# Patient Record
Sex: Female | Born: 1955 | Race: White | Hispanic: No | Marital: Married | Smoking: Never smoker
Health system: Southern US, Community
[De-identification: ages and names within clinical notes are randomized; demographics above are authoritative.]

## PROBLEM LIST (undated history)

## (undated) DIAGNOSIS — D649 Anemia, unspecified: Secondary | ICD-10-CM

## (undated) DIAGNOSIS — Z8489 Family history of other specified conditions: Secondary | ICD-10-CM

## (undated) DIAGNOSIS — I499 Cardiac arrhythmia, unspecified: Secondary | ICD-10-CM

## (undated) DIAGNOSIS — R112 Nausea with vomiting, unspecified: Secondary | ICD-10-CM

## (undated) DIAGNOSIS — R0602 Shortness of breath: Secondary | ICD-10-CM

## (undated) DIAGNOSIS — F329 Major depressive disorder, single episode, unspecified: Secondary | ICD-10-CM

## (undated) DIAGNOSIS — F32A Depression, unspecified: Secondary | ICD-10-CM

## (undated) DIAGNOSIS — I10 Essential (primary) hypertension: Secondary | ICD-10-CM

## (undated) DIAGNOSIS — Z9889 Other specified postprocedural states: Secondary | ICD-10-CM

## (undated) DIAGNOSIS — Z8719 Personal history of other diseases of the digestive system: Secondary | ICD-10-CM

## (undated) DIAGNOSIS — M1990 Unspecified osteoarthritis, unspecified site: Secondary | ICD-10-CM

## (undated) DIAGNOSIS — K5190 Ulcerative colitis, unspecified, without complications: Secondary | ICD-10-CM

## (undated) DIAGNOSIS — F419 Anxiety disorder, unspecified: Secondary | ICD-10-CM

## (undated) HISTORY — PX: CHOLECYSTECTOMY: SHX55

## (undated) HISTORY — PX: HERNIA REPAIR: SHX51

## (undated) HISTORY — PX: MOHS SURGERY: SHX181

## (undated) HISTORY — PX: BACK SURGERY: SHX140

---

## 2000-09-10 ENCOUNTER — Other Ambulatory Visit: Admission: RE | Admit: 2000-09-10 | Discharge: 2000-09-10 | Payer: Self-pay | Admitting: Obstetrics and Gynecology

## 2000-09-23 ENCOUNTER — Other Ambulatory Visit: Admission: RE | Admit: 2000-09-23 | Discharge: 2000-09-23 | Payer: Self-pay | Admitting: Obstetrics and Gynecology

## 2000-09-23 ENCOUNTER — Encounter (INDEPENDENT_AMBULATORY_CARE_PROVIDER_SITE_OTHER): Payer: Self-pay | Admitting: Specialist

## 2000-12-28 ENCOUNTER — Ambulatory Visit (HOSPITAL_COMMUNITY): Admission: RE | Admit: 2000-12-28 | Discharge: 2000-12-28 | Payer: Self-pay | Admitting: Family Medicine

## 2000-12-28 ENCOUNTER — Encounter: Payer: Self-pay | Admitting: Family Medicine

## 2001-01-21 ENCOUNTER — Ambulatory Visit (HOSPITAL_COMMUNITY): Admission: RE | Admit: 2001-01-21 | Discharge: 2001-01-21 | Payer: Self-pay | Admitting: *Deleted

## 2001-01-26 ENCOUNTER — Encounter: Admission: RE | Admit: 2001-01-26 | Discharge: 2001-04-26 | Payer: Self-pay | Admitting: Family Medicine

## 2002-11-13 ENCOUNTER — Other Ambulatory Visit: Admission: RE | Admit: 2002-11-13 | Discharge: 2002-11-13 | Payer: Self-pay | Admitting: Family Medicine

## 2003-07-27 ENCOUNTER — Other Ambulatory Visit: Admission: RE | Admit: 2003-07-27 | Discharge: 2003-07-27 | Payer: Self-pay | Admitting: Family Medicine

## 2006-04-14 ENCOUNTER — Other Ambulatory Visit: Admission: RE | Admit: 2006-04-14 | Discharge: 2006-04-14 | Payer: Self-pay | Admitting: Family Medicine

## 2007-05-23 ENCOUNTER — Other Ambulatory Visit: Admission: RE | Admit: 2007-05-23 | Discharge: 2007-05-23 | Payer: Self-pay | Admitting: Family Medicine

## 2007-07-08 ENCOUNTER — Encounter (INDEPENDENT_AMBULATORY_CARE_PROVIDER_SITE_OTHER): Payer: Self-pay | Admitting: Surgery

## 2007-07-08 ENCOUNTER — Ambulatory Visit (HOSPITAL_COMMUNITY): Admission: RE | Admit: 2007-07-08 | Discharge: 2007-07-08 | Payer: Self-pay | Admitting: Surgery

## 2007-08-04 ENCOUNTER — Encounter: Admission: RE | Admit: 2007-08-04 | Discharge: 2007-08-04 | Payer: Self-pay | Admitting: Surgery

## 2007-09-22 ENCOUNTER — Encounter: Admission: RE | Admit: 2007-09-22 | Discharge: 2007-09-22 | Payer: Self-pay | Admitting: Gastroenterology

## 2007-10-22 ENCOUNTER — Encounter: Admission: RE | Admit: 2007-10-22 | Discharge: 2007-10-22 | Payer: Self-pay | Admitting: Gastroenterology

## 2008-02-03 ENCOUNTER — Ambulatory Visit (HOSPITAL_COMMUNITY): Admission: RE | Admit: 2008-02-03 | Discharge: 2008-02-03 | Payer: Self-pay | Admitting: Gastroenterology

## 2008-03-23 ENCOUNTER — Ambulatory Visit (HOSPITAL_COMMUNITY): Admission: RE | Admit: 2008-03-23 | Discharge: 2008-03-23 | Payer: Self-pay | Admitting: Gastroenterology

## 2010-11-11 NOTE — Op Note (Signed)
NAMECHEYAN, FREES              ACCOUNT NO.:  [id number]   MEDICAL RECORD NO.:  [id number]          PATIENT TYPE:  AMB   LOCATION:  DAY                          FACILITY:  [facility]   PHYSICIAN:  Wilmon Arms. Corliss Skains, M.D. DATE OF BIRTH:  Jul 28, 1955   DATE OF PROCEDURE:  07/08/2007  DATE OF DISCHARGE:                               OPERATIVE REPORT   PREOPERATIVE DIAGNOSIS:  Chronic calculus cholecystitis.   POSTOPERATIVE DIAGNOSIS:  Chronic calculus cholecystitis.   PROCEDURE PERFORMED:  Laparoscopic cholecystectomy with intraoperative  cholangiogram.   SURGEON:  Wilmon Arms. Tsuei, M.D., FACS   ANESTHESIA:  General endotracheal.   INDICATIONS:  The patient is a 55 year old female who presents with a 7  month history of intermittent epigastric pain radiating to her back  associated with nausea, vomiting and bloating.  Ultrasound showed  cholelithiasis with no sign of cholecystitis.  She was referred for  surgical evaluation.  We recommended elective cholecystectomy.   DESCRIPTION OF PROCEDURE:  The patient was brought to the operating room  and placed in the supine position on the operating table.  After an  adequate level of general anesthesia was obtained, the patient's abdomen  was prepped with Betadine and draped in sterile fashion.  The area below  her umbilicus was infiltrated with 0.25% Marcaine with epinephrine.  A  transverse incision was made below the umbilicus.  Dissection was  carried down to the fascia.  The fascia was opened vertically.  The  peritoneal cavity was bluntly entered.  A stay suture of zero Vicryl was  placed around the fascial opening.  The Hasson cannula was inserted and  secured with the stay suture.  Pneumoperitoneum was obtained by  insufflating with CO2 maintaining a maximal pressure of 15 mmHg.  The  laparoscope was inserted.  The patient was positioned in reverse  Trendelenburg and tilted to her left.  An 11 mm port was placed in the  subxiphoid  position.  Two 5 mm ports were placed in the right upper  quadrant.  The gallbladder was then exposed.  The patient has a very  large floppy left lobe of the liver and this precluded [place] seeing the  hilum of the gallbladder.  Also, the patient's gallbladder was quite  distended.  We inserted the suction aspirator into the fundus of the  gallbladder and suctioned out as much bile as we could.  This revealed  that the gallbladder was completely packed with gallstones.  We were  able to elevate it with a grasper.  An additional 5 mm port was placed  in the right upper quadrant and the flexible liver retractor was  inserted and used to retract the left lobe of the liver.  We opened the  peritoneum around the hilum of the gallbladder.  The cystic duct was  circumferentially dissected.  We ligated with a clip distally.  A small  opening was created on the cystic duct.  A Cook cholangiogram catheter  was then inserted and threaded into the cystic duct.  A cholangiogram  was obtained which showed good flow proximally and distally biliary tree  with  no sign of filling defects or obstruction.  The catheter was then  removed and the cystic duct was ligated with clips and divided.  The  cystic artery was also ligated with clips and divided.  The gallbladder  was then dissected free from the liver bed with cautery.  Hemostasis was  good.  The gallbladder was then placed in an EndoCatch sac.  We pulled  the gallbladder and sac up to the umbilical port site.  We had to  enlarge our fascial opening slightly due to the amount of stones in the  gallbladder.  This required opening the gallbladder inside the sac and  removing some of the stones.  We were finally able to remove the  gallbladder from the abdomen.  The fascia was then closed with zero  Vicryl.  We reinspected the right upper quadrant and  suctioned out any irrigation.  Pneumoperitoneum was then released as the  trocars were removed.  Monocryl 4-0  was used to close the skin  incisions.  Steri-Strips and clean dressings were applied.  The patient  was then extubated and brought to the recovery room in stable condition.  All sponge, instrument and needle counts were correct.      Wilmon Arms. Tsuei, M.D.  Electronically Signed     MKT/MEDQ  D:  07/08/2007  T:  07/08/2007  Job:  [id number]   cc:   Sigmund Hazel, M.D.  Fax: [phone]

## 2011-03-19 LAB — DIFFERENTIAL
Basophils Absolute: 0
Lymphocytes Relative: 29
Neutro Abs: 3.8
Neutrophils Relative %: 63

## 2011-03-19 LAB — CBC
HCT: 35.2 — ABNORMAL LOW
MCV: 87.7
RBC: 4.01
WBC: 6

## 2011-03-19 LAB — COMPREHENSIVE METABOLIC PANEL
BUN: 12
CO2: 27
Chloride: 106
Creatinine, Ser: 0.97
GFR calc non Af Amer: >60
Total Bilirubin: 0.9

## 2011-03-27 LAB — CREATININE, SERUM
GFR calc Af Amer: >60
GFR calc non Af Amer: >60

## 2011-09-30 ENCOUNTER — Other Ambulatory Visit (HOSPITAL_COMMUNITY)
Admission: RE | Admit: 2011-09-30 | Discharge: 2011-09-30 | Disposition: A | Discharge status: Home / Self Care | Payer: 59 | Source: Ambulatory Visit | Attending: Family Medicine | Admitting: Family Medicine

## 2011-09-30 ENCOUNTER — Other Ambulatory Visit: Payer: Self-pay | Admitting: Family Medicine

## 2011-09-30 DIAGNOSIS — Z1159 Encounter for screening for other viral diseases: Secondary | ICD-10-CM | POA: Insufficient documentation

## 2011-09-30 DIAGNOSIS — Z01419 Encounter for gynecological examination (general) (routine) without abnormal findings: Secondary | ICD-10-CM | POA: Insufficient documentation

## 2013-01-31 ENCOUNTER — Ambulatory Visit (INDEPENDENT_AMBULATORY_CARE_PROVIDER_SITE_OTHER): Payer: BC Managed Care – PPO | Admitting: Licensed Clinical Social Worker

## 2013-01-31 DIAGNOSIS — F411 Generalized anxiety disorder: Secondary | ICD-10-CM

## 2013-01-31 DIAGNOSIS — F331 Major depressive disorder, recurrent, moderate: Secondary | ICD-10-CM

## 2013-02-07 ENCOUNTER — Ambulatory Visit (INDEPENDENT_AMBULATORY_CARE_PROVIDER_SITE_OTHER): Payer: BC Managed Care – PPO | Admitting: Licensed Clinical Social Worker

## 2013-02-07 DIAGNOSIS — F411 Generalized anxiety disorder: Secondary | ICD-10-CM

## 2013-02-07 DIAGNOSIS — F331 Major depressive disorder, recurrent, moderate: Secondary | ICD-10-CM

## 2013-02-09 ENCOUNTER — Ambulatory Visit (INDEPENDENT_AMBULATORY_CARE_PROVIDER_SITE_OTHER): Payer: BC Managed Care – PPO | Admitting: Licensed Clinical Social Worker

## 2013-02-09 DIAGNOSIS — F331 Major depressive disorder, recurrent, moderate: Secondary | ICD-10-CM

## 2013-02-09 DIAGNOSIS — F411 Generalized anxiety disorder: Secondary | ICD-10-CM

## 2013-02-14 ENCOUNTER — Ambulatory Visit (INDEPENDENT_AMBULATORY_CARE_PROVIDER_SITE_OTHER): Payer: BC Managed Care – PPO | Admitting: Licensed Clinical Social Worker

## 2013-02-14 DIAGNOSIS — F331 Major depressive disorder, recurrent, moderate: Secondary | ICD-10-CM

## 2013-02-16 ENCOUNTER — Ambulatory Visit (INDEPENDENT_AMBULATORY_CARE_PROVIDER_SITE_OTHER): Payer: BC Managed Care – PPO | Admitting: Licensed Clinical Social Worker

## 2013-02-16 DIAGNOSIS — F331 Major depressive disorder, recurrent, moderate: Secondary | ICD-10-CM

## 2013-02-16 DIAGNOSIS — F411 Generalized anxiety disorder: Secondary | ICD-10-CM

## 2013-02-21 ENCOUNTER — Ambulatory Visit (INDEPENDENT_AMBULATORY_CARE_PROVIDER_SITE_OTHER): Payer: BC Managed Care – PPO | Admitting: Licensed Clinical Social Worker

## 2013-02-21 DIAGNOSIS — F331 Major depressive disorder, recurrent, moderate: Secondary | ICD-10-CM

## 2013-02-21 DIAGNOSIS — F411 Generalized anxiety disorder: Secondary | ICD-10-CM

## 2013-02-23 ENCOUNTER — Ambulatory Visit (INDEPENDENT_AMBULATORY_CARE_PROVIDER_SITE_OTHER): Payer: BC Managed Care – PPO | Admitting: Licensed Clinical Social Worker

## 2013-02-23 DIAGNOSIS — F331 Major depressive disorder, recurrent, moderate: Secondary | ICD-10-CM

## 2013-02-23 DIAGNOSIS — F411 Generalized anxiety disorder: Secondary | ICD-10-CM

## 2013-02-28 ENCOUNTER — Ambulatory Visit (INDEPENDENT_AMBULATORY_CARE_PROVIDER_SITE_OTHER): Payer: BC Managed Care – PPO | Admitting: Licensed Clinical Social Worker

## 2013-02-28 DIAGNOSIS — F411 Generalized anxiety disorder: Secondary | ICD-10-CM

## 2013-02-28 DIAGNOSIS — F331 Major depressive disorder, recurrent, moderate: Secondary | ICD-10-CM

## 2013-03-02 ENCOUNTER — Ambulatory Visit (INDEPENDENT_AMBULATORY_CARE_PROVIDER_SITE_OTHER): Payer: BC Managed Care – PPO | Admitting: Licensed Clinical Social Worker

## 2013-03-02 DIAGNOSIS — F331 Major depressive disorder, recurrent, moderate: Secondary | ICD-10-CM

## 2013-03-02 DIAGNOSIS — F411 Generalized anxiety disorder: Secondary | ICD-10-CM

## 2013-03-14 ENCOUNTER — Ambulatory Visit (INDEPENDENT_AMBULATORY_CARE_PROVIDER_SITE_OTHER): Payer: BC Managed Care – PPO | Admitting: Licensed Clinical Social Worker

## 2013-03-14 DIAGNOSIS — F411 Generalized anxiety disorder: Secondary | ICD-10-CM

## 2013-03-14 DIAGNOSIS — F331 Major depressive disorder, recurrent, moderate: Secondary | ICD-10-CM

## 2013-03-30 ENCOUNTER — Ambulatory Visit: Payer: BC Managed Care – PPO | Admitting: Licensed Clinical Social Worker

## 2013-04-11 ENCOUNTER — Ambulatory Visit (INDEPENDENT_AMBULATORY_CARE_PROVIDER_SITE_OTHER): Payer: BC Managed Care – PPO | Admitting: Licensed Clinical Social Worker

## 2013-04-11 DIAGNOSIS — F331 Major depressive disorder, recurrent, moderate: Secondary | ICD-10-CM

## 2013-04-11 DIAGNOSIS — F411 Generalized anxiety disorder: Secondary | ICD-10-CM

## 2013-04-27 ENCOUNTER — Ambulatory Visit (INDEPENDENT_AMBULATORY_CARE_PROVIDER_SITE_OTHER): Payer: BC Managed Care – PPO | Admitting: Licensed Clinical Social Worker

## 2013-04-27 DIAGNOSIS — F331 Major depressive disorder, recurrent, moderate: Secondary | ICD-10-CM

## 2013-04-27 DIAGNOSIS — F411 Generalized anxiety disorder: Secondary | ICD-10-CM

## 2013-05-18 ENCOUNTER — Ambulatory Visit: Payer: BC Managed Care – PPO | Admitting: Licensed Clinical Social Worker

## 2013-05-24 ENCOUNTER — Ambulatory Visit (INDEPENDENT_AMBULATORY_CARE_PROVIDER_SITE_OTHER): Payer: BC Managed Care – PPO | Admitting: Licensed Clinical Social Worker

## 2013-05-24 DIAGNOSIS — F411 Generalized anxiety disorder: Secondary | ICD-10-CM

## 2013-05-24 DIAGNOSIS — F331 Major depressive disorder, recurrent, moderate: Secondary | ICD-10-CM

## 2013-06-15 ENCOUNTER — Ambulatory Visit (INDEPENDENT_AMBULATORY_CARE_PROVIDER_SITE_OTHER): Payer: BC Managed Care – PPO | Admitting: Licensed Clinical Social Worker

## 2013-06-15 DIAGNOSIS — F411 Generalized anxiety disorder: Secondary | ICD-10-CM

## 2013-06-15 DIAGNOSIS — F331 Major depressive disorder, recurrent, moderate: Secondary | ICD-10-CM

## 2013-07-05 ENCOUNTER — Ambulatory Visit: Payer: 59 | Admitting: Licensed Clinical Social Worker

## 2013-07-06 ENCOUNTER — Ambulatory Visit (INDEPENDENT_AMBULATORY_CARE_PROVIDER_SITE_OTHER): Payer: 59 | Admitting: Licensed Clinical Social Worker

## 2013-07-06 DIAGNOSIS — F331 Major depressive disorder, recurrent, moderate: Secondary | ICD-10-CM

## 2013-07-06 DIAGNOSIS — F411 Generalized anxiety disorder: Secondary | ICD-10-CM

## 2013-07-14 ENCOUNTER — Ambulatory Visit: Payer: 59 | Admitting: Licensed Clinical Social Worker

## 2013-07-19 ENCOUNTER — Ambulatory Visit (INDEPENDENT_AMBULATORY_CARE_PROVIDER_SITE_OTHER): Payer: 59 | Admitting: Licensed Clinical Social Worker

## 2013-07-19 DIAGNOSIS — F411 Generalized anxiety disorder: Secondary | ICD-10-CM

## 2013-07-19 DIAGNOSIS — F331 Major depressive disorder, recurrent, moderate: Secondary | ICD-10-CM

## 2013-07-28 ENCOUNTER — Ambulatory Visit (INDEPENDENT_AMBULATORY_CARE_PROVIDER_SITE_OTHER): Payer: 59 | Admitting: Licensed Clinical Social Worker

## 2013-07-28 DIAGNOSIS — F331 Major depressive disorder, recurrent, moderate: Secondary | ICD-10-CM

## 2013-07-28 DIAGNOSIS — F411 Generalized anxiety disorder: Secondary | ICD-10-CM

## 2013-08-04 ENCOUNTER — Ambulatory Visit (INDEPENDENT_AMBULATORY_CARE_PROVIDER_SITE_OTHER): Payer: 59 | Admitting: Licensed Clinical Social Worker

## 2013-08-04 DIAGNOSIS — F411 Generalized anxiety disorder: Secondary | ICD-10-CM

## 2013-08-04 DIAGNOSIS — F331 Major depressive disorder, recurrent, moderate: Secondary | ICD-10-CM

## 2013-08-17 ENCOUNTER — Ambulatory Visit: Payer: 59 | Admitting: Licensed Clinical Social Worker

## 2013-08-29 ENCOUNTER — Ambulatory Visit (INDEPENDENT_AMBULATORY_CARE_PROVIDER_SITE_OTHER): Payer: 59 | Admitting: Licensed Clinical Social Worker

## 2013-08-29 DIAGNOSIS — F331 Major depressive disorder, recurrent, moderate: Secondary | ICD-10-CM

## 2013-08-29 DIAGNOSIS — F411 Generalized anxiety disorder: Secondary | ICD-10-CM

## 2013-09-05 ENCOUNTER — Ambulatory Visit (INDEPENDENT_AMBULATORY_CARE_PROVIDER_SITE_OTHER): Payer: 59 | Admitting: Licensed Clinical Social Worker

## 2013-09-05 DIAGNOSIS — F331 Major depressive disorder, recurrent, moderate: Secondary | ICD-10-CM

## 2013-09-05 DIAGNOSIS — F411 Generalized anxiety disorder: Secondary | ICD-10-CM

## 2013-09-08 ENCOUNTER — Ambulatory Visit: Payer: 59 | Admitting: Licensed Clinical Social Worker

## 2013-09-26 ENCOUNTER — Ambulatory Visit: Payer: 59 | Admitting: Licensed Clinical Social Worker

## 2013-10-23 ENCOUNTER — Other Ambulatory Visit: Payer: Self-pay | Admitting: Orthopedic Surgery

## 2013-10-25 ENCOUNTER — Inpatient Hospital Stay (HOSPITAL_COMMUNITY)
Admission: RE | Admit: 2013-10-25 | Discharge: 2013-10-25 | Disposition: A | Discharge status: Home / Self Care | Payer: Self-pay | Source: Ambulatory Visit

## 2013-10-25 NOTE — Progress Notes (Signed)
Not here for pre-admit visit.  Attempted to reach Janice Orr at home number, that number has been disconnected , her cell number is not accepting calls at this time.  Message left at her work number to return call.

## 2013-10-26 ENCOUNTER — Encounter (HOSPITAL_COMMUNITY): Payer: Self-pay

## 2013-10-26 ENCOUNTER — Encounter (HOSPITAL_COMMUNITY)
Admission: RE | Admit: 2013-10-26 | Discharge: 2013-10-26 | Disposition: A | Discharge status: Home / Self Care | Payer: 59 | Source: Ambulatory Visit | Attending: Orthopedic Surgery | Admitting: Orthopedic Surgery

## 2013-10-26 ENCOUNTER — Ambulatory Visit (HOSPITAL_COMMUNITY)
Admission: RE | Admit: 2013-10-26 | Discharge: 2013-10-26 | Disposition: A | Discharge status: Home / Self Care | Payer: 59 | Source: Ambulatory Visit | Attending: Orthopedic Surgery | Admitting: Orthopedic Surgery

## 2013-10-26 DIAGNOSIS — Z01812 Encounter for preprocedural laboratory examination: Secondary | ICD-10-CM | POA: Diagnosis present

## 2013-10-26 DIAGNOSIS — Z01818 Encounter for other preprocedural examination: Secondary | ICD-10-CM | POA: Insufficient documentation

## 2013-10-26 HISTORY — DX: Shortness of breath: R06.02

## 2013-10-26 HISTORY — DX: Other specified postprocedural states: Z98.890

## 2013-10-26 HISTORY — DX: Essential (primary) hypertension: I10

## 2013-10-26 HISTORY — DX: Anemia, unspecified: D64.9

## 2013-10-26 HISTORY — DX: Major depressive disorder, single episode, unspecified: F32.9

## 2013-10-26 HISTORY — DX: Anxiety disorder, unspecified: F41.9

## 2013-10-26 HISTORY — DX: Depression, unspecified: F32.A

## 2013-10-26 HISTORY — DX: Nausea with vomiting, unspecified: R11.2

## 2013-10-26 HISTORY — DX: Personal history of other diseases of the digestive system: Z87.19

## 2013-10-26 HISTORY — DX: Family history of other specified conditions: Z84.89

## 2013-10-26 LAB — CBC WITH DIFFERENTIAL/PLATELET
BASOS ABS: 0 10*3/uL (ref 0.0–0.1)
BASOS PCT: 0 % (ref 0–1)
Eosinophils Absolute: 0.2 10*3/uL (ref 0.0–0.7)
Eosinophils Relative: 4 % (ref 0–5)
HCT: 41.8 % (ref 36.0–46.0)
Hemoglobin: 13.5 g/dL (ref 12.0–15.0)
Lymphocytes Relative: 23 % (ref 12–46)
Lymphs Abs: 1.4 10*3/uL (ref 0.7–4.0)
MCH: 31 pg (ref 26.0–34.0)
MCHC: 32.3 g/dL (ref 30.0–36.0)
MCV: 96.1 fL (ref 78.0–100.0)
Monocytes Absolute: 0.5 10*3/uL (ref 0.1–1.0)
Monocytes Relative: 8 % (ref 3–12)
NEUTROS ABS: 3.9 10*3/uL (ref 1.7–7.7)
Neutrophils Relative %: 65 % (ref 43–77)
PLATELETS: 304 10*3/uL (ref 150–400)
RBC: 4.35 MIL/uL (ref 3.87–5.11)
RDW: 12.4 % (ref 11.5–15.5)
WBC: 6 10*3/uL (ref 4.0–10.5)

## 2013-10-26 LAB — BASIC METABOLIC PANEL
BUN: 16 mg/dL (ref 6–23)
CALCIUM: 8.9 mg/dL (ref 8.4–10.5)
CO2: 24 mEq/L (ref 19–32)
Chloride: 101 mEq/L (ref 96–112)
Creatinine, Ser: 0.92 mg/dL (ref 0.50–1.10)
GFR, EST AFRICAN AMERICAN: 77 mL/min — AB (ref >90)
GFR, EST NON AFRICAN AMERICAN: 67 mL/min — AB (ref >90)
Glucose, Bld: 82 mg/dL (ref 70–99)
POTASSIUM: 4 meq/L (ref 3.7–5.3)
Sodium: 140 mEq/L (ref 137–147)

## 2013-10-26 LAB — APTT: aPTT: 30 seconds (ref 24–37)

## 2013-10-26 LAB — URINALYSIS, ROUTINE W REFLEX MICROSCOPIC
Glucose, UA: NEGATIVE mg/dL
HGB URINE DIPSTICK: NEGATIVE
KETONES UR: NEGATIVE mg/dL
Leukocytes, UA: NEGATIVE
Nitrite: NEGATIVE
PROTEIN: NEGATIVE mg/dL
Specific Gravity, Urine: 1.025 (ref 1.005–1.030)
Urobilinogen, UA: 1 mg/dL (ref 0.0–1.0)
pH: 5 (ref 5.0–8.0)

## 2013-10-26 LAB — SURGICAL PCR SCREEN
MRSA, PCR: NEGATIVE
STAPHYLOCOCCUS AUREUS: POSITIVE — AB

## 2013-10-26 LAB — TYPE AND SCREEN
ABO/RH(D): O POS
Antibody Screen: NEGATIVE

## 2013-10-26 LAB — PROTIME-INR
INR: 0.92 (ref 0.00–1.49)
Prothrombin Time: 12.2 seconds (ref 11.6–15.2)

## 2013-10-26 LAB — ABO/RH: ABO/RH(D): O POS

## 2013-10-26 NOTE — Pre-Procedure Instructions (Signed)
Janice Orr  10/26/2013   Your procedure is scheduled on:  May 4 at 0955  Report to [facility] Admitting at 0800 AM.  Call this number if you have problems the morning of surgery: [phone]   Remember:   Do not eat food or drink liquids after midnight.   Take these medicines the morning of surgery with A SIP OF WATER:   Stop taking Aspirin, Aleve, Ibuprofen, BC's, Goody's, Herbal medications, and Fish Oil   Do not wear jewelry, make-up or nail polish.  Do not wear lotions, powders, or perfumes. You may wear deodorant.  Do not shave 48 hours prior to surgery. Men may shave face and neck.  Do not bring valuables to the hospital.  [facility] is not responsible  for any belongings or valuables.               Contacts, dentures or bridgework may not be worn into surgery.  Leave suitcase in the car. After surgery it may be brought to your room.  For patients admitted to the hospital, discharge time is determined by your treatment team.               Patients discharged the day of surgery will not be allowed to drive home.    Special Instructions: [facility] - Preparing for Surgery  Before surgery, you can play an important role.  Because skin is not sterile, your skin needs to be as free of germs as possible.  You can reduce the number of germs on you skin by washing with CHG (chlorahexidine gluconate) soap before surgery.  CHG is an antiseptic cleaner which kills germs and bonds with the skin to continue killing germs even after washing.  Please DO NOT use if you have an allergy to CHG or antibacterial soaps.  If your skin becomes reddened/irritated stop using the CHG and inform your nurse when you arrive at Short Stay.  Do not shave (including legs and underarms) for at least 48 hours prior to the first CHG shower.  You may shave your face.  Please follow these instructions carefully:   1.  Shower with CHG Soap the night before surgery and the  morning of  Surgery.  2.  If you choose to wash your hair, wash your hair first as usual with your  normal shampoo.  3.  After you shampoo, rinse your hair and body thoroughly to remove the  Shampoo.  4.  Use CHG as you would any other liquid soap.  You can apply chg directly to the skin and wash gently with scrungie or a clean washcloth.  5.  Apply the CHG Soap to your body ONLY FROM THE NECK DOWN.   Do not use on open wounds or open sores.  Avoid contact with your eyes, ears, mouth and genitals (private parts).  Wash genitals (private parts) with your normal soap.  6.  Wash thoroughly, paying special attention to the area where your surgery will be performed.  7.  Thoroughly rinse your body with warm water from the neck down.  8.  DO NOT shower/wash with your normal soap after using and rinsing off  the CHG Soap.  9.  Pat yourself dry with a clean towel.            10.  Wear clean pajamas.            11.  Place clean sheets on your bed the night of your first shower  and do not sleep with pets.  Day of Surgery  Do not apply any lotions/deoderants the morning of surgery.  Please wear clean clothes to the hospital/surgery center.     Please read over the following fact sheets that you were given: Pain Booklet, Coughing and Deep Breathing, Blood Transfusion Information, MRSA Information and Surgical Site Infection Prevention

## 2013-10-26 NOTE — Progress Notes (Addendum)
Denies any cardiac history.   States she had echo--"yrs ago" but can't remember what for.  She does suffer from claustrophia and anxiety.  DA

## 2013-10-26 NOTE — H&P (Signed)
TOTAL HIP ADMISSION H&P  Patient is admitted for right total hip arthroplasty.  Subjective:  Chief Complaint: right hip pain  HPI: Janice Orr, 58 y.o. female, has a history of pain and functional disability in the right hip(s) due to arthritis and patient has failed non-surgical conservative treatments for greater than 12 weeks to include NSAID's and/or analgesics, corticosteriod injections, flexibility and strengthening excercises, weight reduction as appropriate and activity modification.  Onset of symptoms was abrupt starting 1 years ago with rapidlly worsening course since that time.The patient noted no past surgery on the right hip(s).  Patient currently rates pain in the right hip at 10 out of 10 with activity. Patient has night pain, worsening of pain with activity and weight bearing, pain that interfers with activities of daily living and pain with passive range of motion. Patient has evidence of joint space narrowing by imaging studies. This condition presents safety issues increasing the risk of falls. There is no current active infection.  There are no active problems to display for this patient.  Past Medical History  Diagnosis Date  . PONV (postoperative nausea and vomiting)   . Family history of anesthesia complication     father hard to wake up and N/V  . H/O hiatal hernia   . Shortness of breath     states its from anxiety & allergies  . Hypertension   . Anxiety   . Depression   . Anemia     h/o of....none at the moment    Past Surgical History  Procedure Laterality Date  . Back surgery      lumbar  . Cholecystectomy    . Mohs surgery      No prescriptions prior to admission   Allergies  Allergen Reactions  . Codeine Hives    Hallucinations    History  Substance Use Topics  . Smoking status: Never Smoker   . Smokeless tobacco: Not on file  . Alcohol Use: No     Comment: occasiona    No family history on file.   Review of Systems  Constitutional:  Negative.   HENT: Negative.   Eyes: Negative.        Glasses  Respiratory: Positive for shortness of breath.   Cardiovascular: Negative.   Genitourinary: Negative.   Musculoskeletal: Positive for joint pain.  Skin: Negative.   Neurological:       Balance problems  Endo/Heme/Allergies: Bruises/bleeds easily.  Psychiatric/Behavioral: The patient is nervous/anxious.     Objective:  Physical Exam  Constitutional: She is oriented to person, place, and time. She appears well-developed and well-nourished.  HENT:  Head: Normocephalic and atraumatic.  Eyes: Pupils are equal, round, and reactive to light.  Neck: Normal range of motion. Neck supple.  Cardiovascular: Intact distal pulses.   Respiratory: Effort normal.  Musculoskeletal: She exhibits tenderness.  Neurological: She is alert and oriented to person, place, and time.  Skin: Skin is warm and dry.  Psychiatric: She has a normal mood and affect. Her behavior is normal. Judgment and thought content normal.    Vital signs in last 24 hours: Temp:  [98.2 F (36.8 C)] 98.2 F (36.8 C) (04/30 0817) Pulse Rate:  [79] 79 (04/30 0817) Resp:  [20] 20 (04/30 0817) BP: (104)/(71) 104/71 mmHg (04/30 0817) SpO2:  [99 %] 99 % (04/30 0817) Weight:  [88.587 kg (195 lb 4.8 oz)] 88.587 kg (195 lb 4.8 oz) (04/30 0817)  Labs:   There is no height or weight on file to  calculate BMI.   Imaging Review X-rays were once again reviewed and do show bone-on-bone arthritic changes to the superior aspect of the right hip joint.    Assessment/Plan:  End stage arthritis, right hip(s)  The patient history, physical examination, clinical judgement of the provider and imaging studies are consistent with end stage degenerative joint disease of the right hip(s) and total hip arthroplasty is deemed medically necessary. The treatment options including medical management, injection therapy, arthroscopy and arthroplasty were discussed at length. The risks  and benefits of total hip arthroplasty were presented and reviewed. The risks due to aseptic loosening, infection, stiffness, dislocation/subluxation,  thromboembolic complications and other imponderables were discussed.  The patient acknowledged the explanation, agreed to proceed with the plan and consent was signed. Patient is being admitted for inpatient treatment for surgery, pain control, PT, OT, prophylactic antibiotics, VTE prophylaxis, progressive ambulation and ADL's and discharge planning.The patient is planning to be discharged home with home health services

## 2013-10-27 NOTE — Progress Notes (Signed)
Left message for patient to arrive and 07:15

## 2013-10-29 MED ORDER — CEFAZOLIN SODIUM-DEXTROSE 2-3 GM-% IV SOLR: 2.0000 g | INTRAVENOUS | Status: DC

## 2013-10-29 MED ORDER — CHLORHEXIDINE GLUCONATE 4 % EX LIQD
60.0000 mL | Freq: Once | CUTANEOUS | Status: DC
  Filled 2013-10-29: qty 60

## 2013-10-29 MED ORDER — DEXTROSE-NACL 5-0.45 % IV SOLN: INTRAVENOUS | Status: DC

## 2013-10-30 ENCOUNTER — Encounter (HOSPITAL_COMMUNITY)
Admission: RE | Disposition: A | Discharge status: Home Health | Payer: Self-pay | Source: Ambulatory Visit | Attending: Orthopedic Surgery

## 2013-10-30 ENCOUNTER — Inpatient Hospital Stay (HOSPITAL_COMMUNITY)
Admission: RE | Admit: 2013-10-30 | Discharge: 2013-11-01 | DRG: 470 | Disposition: A | Discharge status: Home Health | Payer: 59 | Source: Ambulatory Visit | Attending: Orthopedic Surgery | Admitting: Orthopedic Surgery

## 2013-10-30 ENCOUNTER — Inpatient Hospital Stay (HOSPITAL_COMMUNITY): Payer: 59 | Admitting: Anesthesiology

## 2013-10-30 ENCOUNTER — Encounter (HOSPITAL_COMMUNITY): Payer: Self-pay | Admitting: Anesthesiology

## 2013-10-30 ENCOUNTER — Encounter (HOSPITAL_COMMUNITY): Payer: 59 | Admitting: Anesthesiology

## 2013-10-30 ENCOUNTER — Inpatient Hospital Stay (HOSPITAL_COMMUNITY): Payer: 59

## 2013-10-30 DIAGNOSIS — M169 Osteoarthritis of hip, unspecified: Principal | ICD-10-CM | POA: Diagnosis present

## 2013-10-30 DIAGNOSIS — Z79899 Other long term (current) drug therapy: Secondary | ICD-10-CM

## 2013-10-30 DIAGNOSIS — Z888 Allergy status to other drugs, medicaments and biological substances status: Secondary | ICD-10-CM

## 2013-10-30 DIAGNOSIS — F411 Generalized anxiety disorder: Secondary | ICD-10-CM | POA: Diagnosis present

## 2013-10-30 DIAGNOSIS — F329 Major depressive disorder, single episode, unspecified: Secondary | ICD-10-CM | POA: Diagnosis present

## 2013-10-30 DIAGNOSIS — I10 Essential (primary) hypertension: Secondary | ICD-10-CM | POA: Diagnosis present

## 2013-10-30 DIAGNOSIS — M1610 Unilateral primary osteoarthritis, unspecified hip: Principal | ICD-10-CM | POA: Diagnosis present

## 2013-10-30 DIAGNOSIS — Z7982 Long term (current) use of aspirin: Secondary | ICD-10-CM

## 2013-10-30 DIAGNOSIS — F3289 Other specified depressive episodes: Secondary | ICD-10-CM | POA: Diagnosis present

## 2013-10-30 DIAGNOSIS — Z9089 Acquired absence of other organs: Secondary | ICD-10-CM

## 2013-10-30 DIAGNOSIS — M1611 Unilateral primary osteoarthritis, right hip: Secondary | ICD-10-CM | POA: Diagnosis present

## 2013-10-30 HISTORY — DX: Unspecified osteoarthritis, unspecified site: M19.90

## 2013-10-30 HISTORY — PX: TOTAL HIP ARTHROPLASTY: SHX124

## 2013-10-30 SURGERY — ARTHROPLASTY, HIP, TOTAL,POSTERIOR APPROACH
Anesthesia: General | Site: Hip | Laterality: Right

## 2013-10-30 MED ORDER — LIDOCAINE HCL (CARDIAC) 20 MG/ML IV SOLN
INTRAVENOUS | Status: DC | PRN
  Administered 2013-10-30: 80 mg via INTRAVENOUS

## 2013-10-30 MED ORDER — LOSARTAN POTASSIUM 50 MG PO TABS
50.0000 mg | ORAL_TABLET | Freq: Every day | ORAL | Status: DC
  Administered 2013-10-30 – 2013-11-01 (×3): 50 mg via ORAL
  Filled 2013-10-30 (×4): qty 1

## 2013-10-30 MED ORDER — DEXAMETHASONE SODIUM PHOSPHATE 10 MG/ML IJ SOLN
INTRAMUSCULAR | Status: DC | PRN
  Administered 2013-10-30: 8 mg via INTRAVENOUS

## 2013-10-30 MED ORDER — PHENYLEPHRINE HCL 10 MG/ML IJ SOLN
INTRAMUSCULAR | Status: DC | PRN
  Administered 2013-10-30 (×2): 80 ug via INTRAVENOUS

## 2013-10-30 MED ORDER — PROPOFOL 10 MG/ML IV BOLUS
INTRAVENOUS | Status: AC
  Filled 2013-10-30: qty 20

## 2013-10-30 MED ORDER — OXYCODONE HCL 5 MG PO TABS
5.0000 mg | ORAL_TABLET | ORAL | Status: DC | PRN
  Administered 2013-10-30: 5 mg via ORAL
  Administered 2013-10-30 – 2013-10-31 (×3): 10 mg via ORAL
  Filled 2013-10-30: qty 2
  Filled 2013-10-30: qty 1
  Filled 2013-10-30: qty 2
  Filled 2013-10-30: qty 1
  Filled 2013-10-30: qty 2

## 2013-10-30 MED ORDER — EPHEDRINE SULFATE 50 MG/ML IJ SOLN
INTRAMUSCULAR | Status: DC | PRN
  Administered 2013-10-30 (×2): 5 mg via INTRAVENOUS
  Administered 2013-10-30: 10 mg via INTRAVENOUS
  Administered 2013-10-30: 5 mg via INTRAVENOUS

## 2013-10-30 MED ORDER — SODIUM CHLORIDE 0.9 % IJ SOLN
INTRAMUSCULAR | Status: AC
  Filled 2013-10-30: qty 10

## 2013-10-30 MED ORDER — KCL IN DEXTROSE-NACL 20-5-0.45 MEQ/L-%-% IV SOLN
INTRAVENOUS | Status: DC
  Administered 2013-10-30 – 2013-11-01 (×3): via INTRAVENOUS
  Filled 2013-10-30 (×11): qty 1000

## 2013-10-30 MED ORDER — ONDANSETRON HCL 4 MG/2ML IJ SOLN
INTRAMUSCULAR | Status: AC
  Filled 2013-10-30: qty 2

## 2013-10-30 MED ORDER — LACTATED RINGERS IV SOLN
INTRAVENOUS | Status: DC
  Administered 2013-10-30: 08:00:00 via INTRAVENOUS

## 2013-10-30 MED ORDER — FENTANYL CITRATE 0.05 MG/ML IJ SOLN
INTRAMUSCULAR | Status: AC
Start: 2013-10-30 — End: 2013-10-30
  Filled 2013-10-30: qty 5

## 2013-10-30 MED ORDER — HYDROMORPHONE HCL PF 1 MG/ML IJ SOLN
0.2500 mg | INTRAMUSCULAR | Status: DC | PRN
  Administered 2013-10-30: 0.5 mg via INTRAVENOUS
  Administered 2013-10-30 (×2): 0.25 mg via INTRAVENOUS
  Administered 2013-10-30: 0.5 mg via INTRAVENOUS

## 2013-10-30 MED ORDER — GLYCOPYRROLATE 0.2 MG/ML IJ SOLN
INTRAMUSCULAR | Status: AC
  Filled 2013-10-30: qty 2

## 2013-10-30 MED ORDER — ACETAMINOPHEN 325 MG PO TABS
650.0000 mg | ORAL_TABLET | Freq: Four times a day (QID) | ORAL | Status: DC | PRN
  Administered 2013-10-31: 650 mg via ORAL
  Filled 2013-10-30: qty 2

## 2013-10-30 MED ORDER — FENTANYL CITRATE 0.05 MG/ML IJ SOLN: 50.0000 ug | Freq: Once | INTRAMUSCULAR | Status: DC

## 2013-10-30 MED ORDER — FLUOXETINE HCL 20 MG PO CAPS
40.0000 mg | ORAL_CAPSULE | Freq: Every day | ORAL | Status: DC
  Administered 2013-10-31 – 2013-11-01 (×2): 40 mg via ORAL
  Filled 2013-10-30 (×2): qty 2

## 2013-10-30 MED ORDER — HYDROMORPHONE HCL PF 1 MG/ML IJ SOLN
INTRAMUSCULAR | Status: AC
  Filled 2013-10-30: qty 1

## 2013-10-30 MED ORDER — ACETAMINOPHEN 650 MG RE SUPP: 650.0000 mg | Freq: Four times a day (QID) | RECTAL | Status: DC | PRN

## 2013-10-30 MED ORDER — DIPHENHYDRAMINE HCL 12.5 MG/5ML PO ELIX: 12.5000 mg | ORAL_SOLUTION | ORAL | Status: DC | PRN

## 2013-10-30 MED ORDER — GLYCOPYRROLATE 0.2 MG/ML IJ SOLN
INTRAMUSCULAR | Status: AC
  Filled 2013-10-30: qty 1

## 2013-10-30 MED ORDER — ONDANSETRON HCL 4 MG/2ML IJ SOLN
INTRAMUSCULAR | Status: DC | PRN
  Administered 2013-10-30: 4 mg via INTRAVENOUS

## 2013-10-30 MED ORDER — GLYCOPYRROLATE 0.2 MG/ML IJ SOLN
INTRAMUSCULAR | Status: DC | PRN
  Administered 2013-10-30: 0.4 mg via INTRAVENOUS

## 2013-10-30 MED ORDER — SENNOSIDES-DOCUSATE SODIUM 8.6-50 MG PO TABS: 1.0000 | ORAL_TABLET | Freq: Every evening | ORAL | Status: DC | PRN

## 2013-10-30 MED ORDER — OXYCODONE HCL 5 MG PO TABS: 5.0000 mg | ORAL_TABLET | Freq: Once | ORAL | Status: DC | PRN

## 2013-10-30 MED ORDER — BISACODYL 5 MG PO TBEC: 5.0000 mg | DELAYED_RELEASE_TABLET | Freq: Every day | ORAL | Status: DC | PRN

## 2013-10-30 MED ORDER — ONDANSETRON HCL 4 MG/2ML IJ SOLN
4.0000 mg | Freq: Four times a day (QID) | INTRAMUSCULAR | Status: DC | PRN
  Administered 2013-10-30 – 2013-10-31 (×2): 4 mg via INTRAVENOUS
  Filled 2013-10-30 (×2): qty 2

## 2013-10-30 MED ORDER — METOCLOPRAMIDE HCL 5 MG/ML IJ SOLN
5.0000 mg | Freq: Three times a day (TID) | INTRAMUSCULAR | Status: DC | PRN
  Administered 2013-10-30 – 2013-10-31 (×2): 10 mg via INTRAVENOUS
  Filled 2013-10-30 (×2): qty 2

## 2013-10-30 MED ORDER — HYDROMORPHONE HCL PF 1 MG/ML IJ SOLN
1.0000 mg | INTRAMUSCULAR | Status: DC | PRN
  Administered 2013-10-30 – 2013-11-01 (×4): 1 mg via INTRAVENOUS
  Filled 2013-10-30 (×3): qty 1

## 2013-10-30 MED ORDER — LACTATED RINGERS IV SOLN
INTRAVENOUS | Status: DC | PRN
  Administered 2013-10-30 (×3): via INTRAVENOUS

## 2013-10-30 MED ORDER — METHOCARBAMOL 500 MG PO TABS
500.0000 mg | ORAL_TABLET | Freq: Four times a day (QID) | ORAL | Status: DC | PRN
  Administered 2013-10-30 – 2013-10-31 (×3): 500 mg via ORAL
  Filled 2013-10-30 (×3): qty 1

## 2013-10-30 MED ORDER — METHOCARBAMOL 1000 MG/10ML IJ SOLN
500.0000 mg | Freq: Four times a day (QID) | INTRAVENOUS | Status: DC | PRN
  Filled 2013-10-30 (×3): qty 5

## 2013-10-30 MED ORDER — MIDAZOLAM HCL 5 MG/5ML IJ SOLN
INTRAMUSCULAR | Status: DC | PRN
  Administered 2013-10-30: 2 mg via INTRAVENOUS

## 2013-10-30 MED ORDER — PROMETHAZINE HCL 25 MG/ML IJ SOLN
6.2500 mg | INTRAMUSCULAR | Status: DC | PRN
  Administered 2013-10-30: 6.25 mg via INTRAVENOUS

## 2013-10-30 MED ORDER — LORATADINE 10 MG PO TABS
10.0000 mg | ORAL_TABLET | Freq: Every day | ORAL | Status: DC
  Administered 2013-10-30 – 2013-11-01 (×3): 10 mg via ORAL
  Filled 2013-10-30 (×4): qty 1

## 2013-10-30 MED ORDER — BUPIVACAINE-EPINEPHRINE 0.5% -1:200000 IJ SOLN
INTRAMUSCULAR | Status: DC | PRN
Start: 2013-10-30 — End: 2013-10-30
  Administered 2013-10-30: 20 mL

## 2013-10-30 MED ORDER — METOPROLOL SUCCINATE ER 25 MG PO TB24
25.0000 mg | ORAL_TABLET | Freq: Every day | ORAL | Status: DC
  Administered 2013-10-31 – 2013-11-01 (×2): 25 mg via ORAL
  Filled 2013-10-30 (×2): qty 1

## 2013-10-30 MED ORDER — ONDANSETRON HCL 4 MG PO TABS
4.0000 mg | ORAL_TABLET | Freq: Four times a day (QID) | ORAL | Status: DC | PRN
Start: 2013-10-30 — End: 2013-11-01
  Administered 2013-10-31: 4 mg via ORAL
  Filled 2013-10-30 (×2): qty 1

## 2013-10-30 MED ORDER — DOCUSATE SODIUM 100 MG PO CAPS
100.0000 mg | ORAL_CAPSULE | Freq: Two times a day (BID) | ORAL | Status: DC
  Administered 2013-10-30 – 2013-11-01 (×3): 100 mg via ORAL
  Filled 2013-10-30 (×5): qty 1

## 2013-10-30 MED ORDER — SUCCINYLCHOLINE CHLORIDE 20 MG/ML IJ SOLN
INTRAMUSCULAR | Status: AC
  Filled 2013-10-30: qty 1

## 2013-10-30 MED ORDER — ROCURONIUM BROMIDE 100 MG/10ML IV SOLN
INTRAVENOUS | Status: DC | PRN
  Administered 2013-10-30: 30 mg via INTRAVENOUS

## 2013-10-30 MED ORDER — PHENOL 1.4 % MT LIQD: 1.0000 | OROMUCOSAL | Status: DC | PRN

## 2013-10-30 MED ORDER — LIDOCAINE HCL (CARDIAC) 20 MG/ML IV SOLN
INTRAVENOUS | Status: AC
  Filled 2013-10-30: qty 5

## 2013-10-30 MED ORDER — MUPIROCIN 2 % EX OINT
TOPICAL_OINTMENT | Freq: Once | CUTANEOUS | Status: AC
  Administered 2013-10-30: 1 via NASAL
  Filled 2013-10-30: qty 22

## 2013-10-30 MED ORDER — MENTHOL 3 MG MT LOZG: 1.0000 | LOZENGE | OROMUCOSAL | Status: DC | PRN

## 2013-10-30 MED ORDER — SUCCINYLCHOLINE CHLORIDE 20 MG/ML IJ SOLN
INTRAMUSCULAR | Status: DC | PRN
  Administered 2013-10-30: 120 mg via INTRAVENOUS

## 2013-10-30 MED ORDER — CEFAZOLIN SODIUM-DEXTROSE 2-3 GM-% IV SOLR
2.0000 g | Freq: Once | INTRAVENOUS | Status: AC
  Administered 2013-10-30: 2 g via INTRAVENOUS
  Filled 2013-10-30: qty 50

## 2013-10-30 MED ORDER — ASPIRIN EC 325 MG PO TBEC
325.0000 mg | DELAYED_RELEASE_TABLET | Freq: Every day | ORAL | Status: DC
  Administered 2013-10-31 – 2013-11-01 (×2): 325 mg via ORAL
  Filled 2013-10-30 (×3): qty 1

## 2013-10-30 MED ORDER — BUPIVACAINE-EPINEPHRINE (PF) 0.5% -1:200000 IJ SOLN
INTRAMUSCULAR | Status: AC
  Filled 2013-10-30: qty 30

## 2013-10-30 MED ORDER — PROMETHAZINE HCL 25 MG/ML IJ SOLN
INTRAMUSCULAR | Status: AC
  Filled 2013-10-30: qty 1

## 2013-10-30 MED ORDER — OXYCODONE HCL 5 MG/5ML PO SOLN: 5.0000 mg | Freq: Once | ORAL | Status: DC | PRN

## 2013-10-30 MED ORDER — MAGNESIUM CITRATE PO SOLN: 1.0000 | Freq: Once | ORAL | Status: AC | PRN

## 2013-10-30 MED ORDER — MUPIROCIN 2 % EX OINT
TOPICAL_OINTMENT | CUTANEOUS | Status: AC
  Filled 2013-10-30: qty 22

## 2013-10-30 MED ORDER — DEXAMETHASONE SODIUM PHOSPHATE 4 MG/ML IJ SOLN
INTRAMUSCULAR | Status: AC
  Filled 2013-10-30: qty 2

## 2013-10-30 MED ORDER — PROPOFOL 10 MG/ML IV BOLUS
INTRAVENOUS | Status: DC | PRN
  Administered 2013-10-30: 190 mg via INTRAVENOUS
  Administered 2013-10-30: 30 mg via INTRAVENOUS

## 2013-10-30 MED ORDER — METOCLOPRAMIDE HCL 10 MG PO TABS: 5.0000 mg | ORAL_TABLET | Freq: Three times a day (TID) | ORAL | Status: DC | PRN

## 2013-10-30 MED ORDER — HYDROCORTISONE 1 % EX CREA
TOPICAL_CREAM | Freq: Two times a day (BID) | CUTANEOUS | Status: DC
  Filled 2013-10-30: qty 28

## 2013-10-30 MED ORDER — LORAZEPAM 0.5 MG PO TABS: 0.5000 mg | ORAL_TABLET | Freq: Three times a day (TID) | ORAL | Status: DC | PRN

## 2013-10-30 MED ORDER — TRANEXAMIC ACID 100 MG/ML IV SOLN
1000.0000 mg | INTRAVENOUS | Status: AC
  Administered 2013-10-30: 1000 mg via INTRAVENOUS
  Filled 2013-10-30: qty 10

## 2013-10-30 MED ORDER — FENTANYL CITRATE 0.05 MG/ML IJ SOLN
INTRAMUSCULAR | Status: DC | PRN
  Administered 2013-10-30 (×4): 50 ug via INTRAVENOUS
  Administered 2013-10-30: 100 ug via INTRAVENOUS

## 2013-10-30 MED ORDER — NEOSTIGMINE METHYLSULFATE 10 MG/10ML IV SOLN
INTRAVENOUS | Status: DC | PRN
  Administered 2013-10-30: 3 mg via INTRAVENOUS

## 2013-10-30 MED ORDER — ROCURONIUM BROMIDE 50 MG/5ML IV SOLN
INTRAVENOUS | Status: AC
  Filled 2013-10-30: qty 1

## 2013-10-30 MED ORDER — NEOSTIGMINE METHYLSULFATE 10 MG/10ML IV SOLN
INTRAVENOUS | Status: AC
  Filled 2013-10-30: qty 1

## 2013-10-30 MED ORDER — ZOLPIDEM TARTRATE 5 MG PO TABS
5.0000 mg | ORAL_TABLET | Freq: Every evening | ORAL | Status: DC | PRN
  Administered 2013-10-30: 5 mg via ORAL
  Filled 2013-10-30: qty 1

## 2013-10-30 MED ORDER — EPHEDRINE SULFATE 50 MG/ML IJ SOLN
INTRAMUSCULAR | Status: AC
  Filled 2013-10-30: qty 1

## 2013-10-30 MED ORDER — SODIUM CHLORIDE 0.9 % IR SOLN
Status: DC | PRN
  Administered 2013-10-30: 1000 mL

## 2013-10-30 MED ORDER — OXYCODONE HCL 5 MG PO TABS
ORAL_TABLET | ORAL | Status: AC
  Administered 2013-10-30: 5 mg
  Filled 2013-10-30: qty 1

## 2013-10-30 MED ORDER — PANTOPRAZOLE SODIUM 40 MG PO TBEC
40.0000 mg | DELAYED_RELEASE_TABLET | Freq: Every day | ORAL | Status: DC
  Administered 2013-10-30 – 2013-11-01 (×3): 40 mg via ORAL
  Filled 2013-10-30 (×3): qty 1

## 2013-10-30 MED ORDER — METHOCARBAMOL 500 MG PO TABS
ORAL_TABLET | ORAL | Status: AC
  Administered 2013-10-30: 500 mg
  Filled 2013-10-30: qty 1

## 2013-10-30 MED ORDER — MIDAZOLAM HCL 2 MG/2ML IJ SOLN
INTRAMUSCULAR | Status: AC
  Filled 2013-10-30: qty 2

## 2013-10-30 MED ORDER — MIDAZOLAM HCL 2 MG/2ML IJ SOLN
1.0000 mg | INTRAMUSCULAR | Status: DC | PRN
Start: 2013-10-30 — End: 2013-10-30

## 2013-10-30 SURGICAL SUPPLY — 53 items
BLADE 10 SAFETY STRL DISP (BLADE) ×2 IMPLANT
BLADE SAW SGTL 18X1.27X75 (BLADE) ×2 IMPLANT
BRUSH FEMORAL CANAL (MISCELLANEOUS) IMPLANT
CAPT HIP PF COP ×2 IMPLANT
COVER BACK TABLE 24X17X13 BIG (DRAPES) IMPLANT
COVER SURGICAL LIGHT HANDLE (MISCELLANEOUS) ×4 IMPLANT
DRAPE ORTHO SPLIT 77X108 STRL (DRAPES) ×1
DRAPE PROXIMA HALF (DRAPES) ×2 IMPLANT
DRAPE SURG ORHT 6 SPLT 77X108 (DRAPES) ×1 IMPLANT
DRAPE U-SHAPE 47X51 STRL (DRAPES) ×2 IMPLANT
DRILL BIT 7/64X5 (BIT) ×2 IMPLANT
DRSG AQUACEL AG ADV 3.5X10 (GAUZE/BANDAGES/DRESSINGS) ×2 IMPLANT
DURAPREP 26ML APPLICATOR (WOUND CARE) ×2 IMPLANT
ELECT BLADE 4.0 EZ CLEAN MEGAD (MISCELLANEOUS)
ELECT REM PT RETURN 9FT ADLT (ELECTROSURGICAL) ×2
ELECTRODE BLDE 4.0 EZ CLN MEGD (MISCELLANEOUS) IMPLANT
ELECTRODE REM PT RTRN 9FT ADLT (ELECTROSURGICAL) ×1 IMPLANT
GAUZE XEROFORM 1X8 LF (GAUZE/BANDAGES/DRESSINGS) ×2 IMPLANT
GLOVE BIO SURGEON STRL SZ7.5 (GLOVE) ×2 IMPLANT
GLOVE BIO SURGEON STRL SZ8.5 (GLOVE) ×4 IMPLANT
GLOVE BIOGEL PI IND STRL 8 (GLOVE) ×2 IMPLANT
GLOVE BIOGEL PI IND STRL 9 (GLOVE) ×1 IMPLANT
GLOVE BIOGEL PI INDICATOR 8 (GLOVE) ×2
GLOVE BIOGEL PI INDICATOR 9 (GLOVE) ×1
GOWN STRL REUS W/ TWL LRG LVL3 (GOWN DISPOSABLE) ×2 IMPLANT
GOWN STRL REUS W/ TWL XL LVL3 (GOWN DISPOSABLE) ×3 IMPLANT
GOWN STRL REUS W/TWL LRG LVL3 (GOWN DISPOSABLE) ×2
GOWN STRL REUS W/TWL XL LVL3 (GOWN DISPOSABLE) ×3
HANDPIECE INTERPULSE COAX TIP (DISPOSABLE)
HOOD PEEL AWAY FACE SHEILD DIS (HOOD) ×4 IMPLANT
KIT BASIN OR (CUSTOM PROCEDURE TRAY) ×2 IMPLANT
KIT ROOM TURNOVER OR (KITS) ×2 IMPLANT
MANIFOLD NEPTUNE II (INSTRUMENTS) IMPLANT
NEEDLE 22X1 1/2 (OR ONLY) (NEEDLE) ×2 IMPLANT
NS IRRIG 1000ML POUR BTL (IV SOLUTION) ×2 IMPLANT
PACK TOTAL JOINT (CUSTOM PROCEDURE TRAY) ×2 IMPLANT
PAD ARMBOARD 7.5X6 YLW CONV (MISCELLANEOUS) ×4 IMPLANT
PASSER SUT SWANSON 36MM LOOP (INSTRUMENTS) ×2 IMPLANT
PRESSURIZER FEMORAL UNIV (MISCELLANEOUS) IMPLANT
SET HNDPC FAN SPRY TIP SCT (DISPOSABLE) IMPLANT
SUT ETHIBOND 2 V 37 (SUTURE) ×2 IMPLANT
SUT VIC AB 0 CTB1 27 (SUTURE) ×2 IMPLANT
SUT VIC AB 1 CTX 36 (SUTURE) ×1
SUT VIC AB 1 CTX36XBRD ANBCTR (SUTURE) ×1 IMPLANT
SUT VIC AB 2-0 CTB1 (SUTURE) ×2 IMPLANT
SUT VIC AB 3-0 SH 27 (SUTURE) ×1
SUT VIC AB 3-0 SH 27X BRD (SUTURE) ×1 IMPLANT
SYR CONTROL 10ML LL (SYRINGE) ×2 IMPLANT
TOWEL OR 17X24 6PK STRL BLUE (TOWEL DISPOSABLE) ×2 IMPLANT
TOWEL OR 17X26 10 PK STRL BLUE (TOWEL DISPOSABLE) ×2 IMPLANT
TOWER CARTRIDGE SMART MIX (DISPOSABLE) IMPLANT
TRAY FOLEY CATH 14FR (SET/KITS/TRAYS/PACK) IMPLANT
WATER STERILE IRR 1000ML POUR (IV SOLUTION) ×8 IMPLANT

## 2013-10-30 NOTE — Interval H&P Note (Signed)
History and Physical Interval Note:  10/30/2013 9:29 AM  Janice Orr  has presented today for surgery, with the diagnosis of OSTEOARTHRITIS RIGHT HIP  The various methods of treatment have been discussed with the patient and family. After consideration of risks, benefits and other options for treatment, the patient has consented to  Procedure(s): RIGHT TOTAL HIP ARTHROPLASTY (Right) as a surgical intervention .  The patient's history has been reviewed, patient examined, no change in status, stable for surgery.  I have reviewed the patient's chart and labs.  Questions were answered to the patient's satisfaction.     Kerin Salen

## 2013-10-30 NOTE — Progress Notes (Signed)
Utilization review completed.  

## 2013-10-30 NOTE — Anesthesia Postprocedure Evaluation (Signed)
  Anesthesia Post-op Note  Patient: Janice Orr  Procedure(s) Performed: Procedure(s): RIGHT TOTAL HIP ARTHROPLASTY (Right)  Patient Location: PACU  Anesthesia Type:General  Level of Consciousness: awake  Airway and Oxygen Therapy: Patient Spontanous Breathing  Post-op Pain: mild  Post-op Assessment: Post-op Vital signs reviewed  Post-op Vital Signs: Reviewed and stable  Last Vitals:  Filed Vitals:   10/30/13 1442  BP: 109/62  Pulse: 68  Temp: 36.4 C  Resp: 12    Complications: No apparent anesthesia complications

## 2013-10-30 NOTE — Anesthesia Procedure Notes (Signed)
Procedure Name: Intubation Date/Time: 10/30/2013 9:43 AM Performed by: Jenne Campus Pre-anesthesia Checklist: Patient identified, Emergency Drugs available, Suction available, Patient being monitored and Timeout performed Patient Re-evaluated:Patient Re-evaluated prior to inductionOxygen Delivery Method: Circle system utilized Preoxygenation: Pre-oxygenation with 100% oxygen Intubation Type: IV induction, Rapid sequence and Cricoid Pressure applied Laryngoscope Size: Miller and 2 Grade View: Grade I Tube type: Oral Tube size: 7.5 mm Number of attempts: 1 Airway Equipment and Method: Stylet Placement Confirmation: ETT inserted through vocal cords under direct vision,  positive ETCO2,  CO2 detector and breath sounds checked- equal and bilateral Secured at: 22 cm Tube secured with: Tape Dental Injury: Teeth and Oropharynx as per pre-operative assessment  Comments: Patient reports symptomatic reflux d/t hiatal hernia. Did not take PPI this am. Plan for RSI. Cords and oropharynx clear on atraum intubation.

## 2013-10-30 NOTE — Transfer of Care (Signed)
Immediate Anesthesia Transfer of Care Note  Patient: Janice Orr  Procedure(s) Performed: Procedure(s): RIGHT TOTAL HIP ARTHROPLASTY (Right)  Patient Location: PACU  Anesthesia Type:General  Level of Consciousness: awake, alert  and patient cooperative  Airway & Oxygen Therapy: Patient Spontanous Breathing and Patient connected to nasal cannula oxygen  Post-op Assessment: Report given to PACU RN and Post -op Vital signs reviewed and stable  Post vital signs: Reviewed  Complications: No apparent anesthesia complications

## 2013-10-30 NOTE — Anesthesia Preprocedure Evaluation (Addendum)
Anesthesia Evaluation  Patient identified by MRN, date of birth, ID band Patient awake    Reviewed: Allergy & Precautions, H&P , NPO status , Patient's Chart, lab work & pertinent test results, reviewed documented beta blocker date and time   History of Anesthesia Complications (+) PONV and history of anesthetic complications  Airway Mallampati: II TM Distance: >3 FB Neck ROM: Full    Dental  (+) Teeth Intact, Dental Advisory Given   Pulmonary neg pulmonary ROS,  breath sounds clear to auscultation        Cardiovascular hypertension, Pt. on medications and Pt. on home beta blockers Rhythm:Regular Rate:Normal     Neuro/Psych PSYCHIATRIC DISORDERS Anxiety Depression negative neurological ROS     GI/Hepatic Neg liver ROS, hiatal hernia,   Endo/Other  negative endocrine ROS  Renal/GU negative Renal ROS     Musculoskeletal negative musculoskeletal ROS (+)   Abdominal   Peds  Hematology   Anesthesia Other Findings   Reproductive/Obstetrics                          Anesthesia Physical Anesthesia Plan  ASA: II  Anesthesia Plan: General   Post-op Pain Management:    Induction: Intravenous  Airway Management Planned: Oral ETT  Additional Equipment:   Intra-op Plan:   Post-operative Plan: Extubation in OR  Informed Consent: I have reviewed the patients History and Physical, chart, labs and discussed the procedure including the risks, benefits and alternatives for the proposed anesthesia with the patient or authorized representative who has indicated his/her understanding and acceptance.     Plan Discussed with: CRNA and Surgeon  Anesthesia Plan Comments:         Anesthesia Quick Evaluation

## 2013-10-30 NOTE — Progress Notes (Signed)
Orthopedic Tech Progress Note Patient Details:  Janice Orr [birth year] [id number]  Ortho Devices Ortho Device/Splint Location: trapeze bar patient helper   Hildred Priest 10/30/2013, 3:18 PM

## 2013-10-30 NOTE — Op Note (Signed)
OPERATIVE REPORT    DATE OF PROCEDURE:  10/30/2013       PREOPERATIVE DIAGNOSIS:  OSTEOARTHRITIS RIGHT HIP                                                          POSTOPERATIVE DIAGNOSIS:  OSTEOARTHRITIS RIGHT HIP                                                           PROCEDURE:  R total hip arthroplasty using a 52 mm DePuy Pinnacle  Cup, [employer], 10-degree polyethylene liner index superior  and posterior, a +0 36 mm ceramic head, a 18x13x42x160 SROM stem, 18DL Sleeve   SURGEON: Kerin Salen    ASSISTANT:   Kerry Hough. [employer]  (present throughout entire procedure and necessary for timely completion of the procedure)   ANESTHESIA: General BLOOD LOSS: 300 FLUID REPLACEMENT: 1500 crystalloid DRAINS: Foley Catheter URINE OUTPUT: 938BO COMPLICATIONS: none    INDICATIONS FOR PROCEDURE: A 58 y.o. year-old With  [facility]   for 2 years, x-rays show bone-on-bone arthritic changes. Despite conservative measures with observation, anti-inflammatory medicine, narcotics, use of a cane, has severe unremitting pain and can ambulate only a few blocks before resting.  Patient desires elective R total hip arthroplasty to decrease pain and increase function. The risks, benefits, and alternatives were discussed at length including but not limited to the risks of infection, bleeding, nerve injury, stiffness, blood clots, the need for revision surgery, cardiopulmonary complications, among others, and they were willing to proceed. Questions answered     PROCEDURE IN DETAIL: The patient was identified by armband,  received preoperative IV antibiotics in the holding area at [facility], taken to the operating room , appropriate anesthetic monitors  were attached and general endotracheal anesthesia induced. Foley catheter was inserted. Pt was rolled into the L lateral decubitus position and fixed there with a Stulberg Mark II pelvic clamp.  The R lower extremity was  then prepped and draped  in the usual sterile fashion from the ankle to the hemipelvis. A time-out  procedure was performed. The skin along the lateral hip and thigh  infiltrated with 10 mL of 0.5% Marcaine and epinephrine solution. We  then made a posterolateral approach to the hip. With a #10 blade, a 14 cm  incision was made through the skin and subcutaneous tissue down to the level of the  IT band. Small bleeders were identified and cauterized. The IT band was cut in  line with skin incision exposing the greater trochanter. A Cobra retractor was placed between the gluteus minimus and the superior hip joint capsule, and a spiked Cobra between the quadratus femoris and the inferior hip joint capsule. This isolated the short  external rotators and piriformis tendons. These were tagged with a #2 Ethibond  suture and cut off their insertion on the intertrochanteric crest. The posterior  capsule was then developed into an acetabular-based flap from Posterior Superior off of the acetabulum out over the femoral neck and back posterior inferior to the acetabular rim. This flap was tagged with two #2  Ethibond sutures and retracted protecting the sciatic nerve. This exposed the arthritic femoral head and osteophytes. The hip was then flexed and internally rotated, dislocating the femoral head and a standard neck cut performed 1 fingerbreadth above the lesser trochanter.  A spiked Cobra was placed in the cotyloid notch and a Hohmann retractor was then used to lever the femur anteriorly off of the anterior pelvic column. A posterior-inferior wing retractor was placed at the junction of the acetabulum and the ischium completing the acetabular exposure.We then removed the peripheral osteophytes and labrum from the acetabulum. The acetabulum was noted to be relatively shallow consistent with mild developmental hip dysplasia, and her high neck shaft angle with a long femoral neck. We then reamed the acetabulum up to 51  mm with basket reamers obtaining good coverage in all quadrants. We then irrigated with normal  saline solution and hammered into place a 52 mm pinnacle cup in 45  degrees of abduction and about 20 degrees of anteversion. More  peripheral osteophytes removed and a trial 10-degree liner placed with the  index superior-posterior. The hip was then flexed and internally rotated exposing the  proximal femur, which was entered with the initiating reamer followed by  the axial reamers up to a 13.5 mm full depth and 44mm partial depth. We then conically reamed to 18D to the correct depth for a 42 base neck. The calcar was milled to 18DL. A trial cone and stem was inserted in the 25 degrees anteversion, with a +0 13mm trial head. Trial reduction was then performed and excellent stability was noted with at 90 of flexion with 75 of internal rotation and then full extension with maximal external rotation. The hip could not be dislocated in full extension. The knee could easily flex  to about 130 degrees. We also stretched the abductors at this point,  because of the preexisting adductor contractures. All trial components  were then removed. The acetabulum was irrigated out with normal saline  solution. A titanium [facility] was then screwed into place  followed by a 10-degree polyethylene liner index superior-posterior. On  the femoral side a 18DL ZTT1 sleeve was hammered into place, followed by a 18x13x42x160 SROM stem in 25 degrees of anteversion. At this point, a +0 36 mm ceramic head was  hammered on the stem. The hip was reduced. We checked our stability  one more time and found it to be excellent. The wound was once again  thoroughly irrigated out with normal saline solution pulse lavage. The  capsular flap and short external rotators were repaired back to the  intertrochanteric crest through drill holes with a #2 Ethibond suture.  The IT band was closed with running 1 Vicryl suture. The  subcutaneous  tissue with 0 and 2-0 undyed Vicryl suture and the skin with running  3-0 Vicryl subcuticular suture. Dressing of Xeroform and Mepilex was  then applied. The patient was then unclamped, rolled supine, awaken extubated and taken to recovery room without difficulty in stable condition.   Kerin Salen 10/30/2013, 11:09 AM

## 2013-10-30 NOTE — Progress Notes (Signed)
Orthopedic Tech Progress Note Patient Details:  Janice Orr 1955/08/09 [id number]  Patient ID: [name], female   DOB: 09-Dec-1955, 58 y.o.   MRN: [id number] Viewed order from doctor's order list  Hildred Priest 10/30/2013, 3:18 PM

## 2013-10-31 ENCOUNTER — Encounter (HOSPITAL_COMMUNITY): Payer: Self-pay | Admitting: Orthopedic Surgery

## 2013-10-31 LAB — CBC
HCT: 32.1 % — ABNORMAL LOW (ref 36.0–46.0)
HEMOGLOBIN: 10.4 g/dL — AB (ref 12.0–15.0)
MCH: 30.4 pg (ref 26.0–34.0)
MCHC: 32.4 g/dL (ref 30.0–36.0)
MCV: 93.9 fL (ref 78.0–100.0)
Platelets: 247 10*3/uL (ref 150–400)
RBC: 3.42 MIL/uL — AB (ref 3.87–5.11)
RDW: 12.4 % (ref 11.5–15.5)
WBC: 9.6 10*3/uL (ref 4.0–10.5)

## 2013-10-31 LAB — BASIC METABOLIC PANEL
BUN: 10 mg/dL (ref 6–23)
CALCIUM: 8.6 mg/dL (ref 8.4–10.5)
CHLORIDE: 100 meq/L (ref 96–112)
CO2: 25 meq/L (ref 19–32)
Creatinine, Ser: 0.81 mg/dL (ref 0.50–1.10)
GFR calc Af Amer: >90 mL/min (ref >90)
GFR calc non Af Amer: 78 mL/min — ABNORMAL LOW (ref >90)
GLUCOSE: 115 mg/dL — AB (ref 70–99)
POTASSIUM: 4.2 meq/L (ref 3.7–5.3)
SODIUM: 139 meq/L (ref 137–147)

## 2013-10-31 MED ORDER — PROMETHAZINE HCL 25 MG/ML IJ SOLN
12.5000 mg | Freq: Four times a day (QID) | INTRAMUSCULAR | Status: DC | PRN
  Administered 2013-10-31: 12.5 mg via INTRAVENOUS
  Filled 2013-10-31: qty 1

## 2013-10-31 MED ORDER — METHOCARBAMOL 500 MG PO TABS: ORAL_TABLET | ORAL | Status: DC

## 2013-10-31 MED ORDER — OXYCODONE-ACETAMINOPHEN 5-325 MG PO TABS: 1.0000 | ORAL_TABLET | ORAL | Status: DC | PRN

## 2013-10-31 MED ORDER — ASPIRIN EC 325 MG PO TBEC: 325.0000 mg | DELAYED_RELEASE_TABLET | Freq: Two times a day (BID) | ORAL | Status: DC

## 2013-10-31 NOTE — Evaluation (Addendum)
Physical Therapy Evaluation Patient Details Name: Janice Orr MRN: [id number] DOB: 09/04/1955 Today's Date: 10/31/2013   History of Present Illness  58 yo female who underwent a posterior approach THR 10-30-13.  Past history include hypertension, anxiety, back surgery  Clinical Impression  Pt limited in mobility one day after surgery by nausea. Pt would benefit from staying in hospital overnight tonight and d/c to home after one more PT session in am    Follow Up Recommendations Home health PT    Equipment Recommendations  None recommended by PT (pt may need a crutch for flight of stairs to bedroom if husband does not find it at home)    Recommendations for Other Services       Precautions / Restrictions Precautions Precautions: Posterior Hip Precaution Booklet Issued: Yes (comment) Precaution Comments: reminded pt to keep her shoulders back during activities as a good way to adhere to total hip precautions Restrictions Weight Bearing Restrictions: Yes RLE Weight Bearing: Weight bearing as tolerated      Mobility  Bed Mobility Overal bed mobility: Needs Assistance Bed Mobility: Sit to Supine     Supine to sit: HOB elevated;Min guard (use of bed rails, pt able to manage RLE off bed)     General bed mobility comments: needs assist to move legs up onto bed  Transfers Overall transfer level: Needs assistance Equipment used: Rolling walker (2 wheeled) Transfers: Sit to/from [facility] Sit to Stand: Min guard Stand pivot transfers: Min guard       General transfer comment: occasional verbal cues to keep right foot out in front of her and to keep shoulders back  Ambulation/Gait Ambulation/Gait assistance: Min assist Ambulation Distance (Feet): 75 Feet Assistive device: Rolling walker (2 wheeled) Gait Pattern/deviations: Step-to pattern;Antalgic;Decreased step length - right;Decreased weight shift to right Gait velocity: decreased       Stairs Stairs: Yes Stairs assistance: Mod assist Stair Management: One rail Left;Step to pattern;With crutches;No rails;Backwards;With walker Number of Stairs: 4 General stair comments: attempted to go up steps backward and pt was only able to negotiate one small step until she began to get lightheaded.  after a rest, pt went up the steps with one handrail and one crutuch, but needed mod assist and was nauseaus after. and had to have a chair pulled up  Wheelchair Mobility    Modified Rankin (Stroke Patients Only)       Balance Overall balance assessment: Needs assistance Sitting-balance support: No upper extremity supported;Feet supported Sitting balance-Leahy Scale: Good     Standing balance support: Bilateral upper extremity supported;During functional activity Standing balance-Leahy Scale: Fair                               Pertinent Vitals/Pain Pt c/o nausea and only occasional pain in right leg    Home Living Family/patient expects to be discharged to:: Private residence Living Arrangements: Spouse/significant other Available Help at Discharge: Family;Available 24 hours/day (husband and son) Type of Home: House Home Access: Stairs to enter Entrance Stairs-Rails: None Entrance Stairs-Number of Steps: 2 (from garage) Home Layout: Two level Home Equipment: None;Walker - 2 wheels;Cane - single point Additional Comments: husband will check to see if patient has crutches at home     Prior Function Level of Independence: Independent               Hand Dominance   Dominant Hand: Left    Extremity/Trunk Assessment  Upper Extremity Assessment: Overall WFL for tasks assessed           Lower Extremity Assessment: RLE deficits/detail RLE Deficits / Details: pt is able to move all muscle groups of RLE, but has pain. she tends to keep hip in semi flexed position    Cervical / Trunk Assessment: Normal  Communication   Communication: No  difficulties  Cognition Arousal/Alertness: Awake/alert Behavior During Therapy: WFL for tasks assessed/performed Overall Cognitive Status: Within Functional Limits for tasks assessed                      General Comments      Exercises Total Joint Exercises Ankle Circles/Pumps: Both Other Exercises Other Exercises: encouraged pt to perform chair push ups to improve tricep strength for use of RW      Assessment/Plan    PT Assessment Patient needs continued PT services  PT Diagnosis Difficulty walking;Abnormality of gait;Generalized weakness;Acute pain   PT Problem List Decreased strength;Decreased activity tolerance;Decreased balance;Decreased safety awareness;Decreased knowledge of precautions;Decreased knowledge of use of DME;Decreased mobility  PT Treatment Interventions DME instruction;Gait training;Stair training;Functional mobility training;Therapeutic activities;Therapeutic exercise;Patient/family education   PT Goals (Current goals can be found in the Care Plan section) Acute Rehab PT Goals Patient Stated Goal: to go home PT Goal Formulation: With patient Time For Goal Achievement: 11/07/13 Potential to Achieve Goals: Good    Frequency 7X/week   Barriers to discharge        Co-evaluation               End of Session   Activity Tolerance: Treatment limited secondary to medical complications (Comment) (nausea  ) Patient left: with family/visitor present Nurse Communication: Mobility status         Time: 1430-1500 PT Time Calculation (min): 30 min   Charges:   PT Evaluation $Initial PT Evaluation Tier I: 1 Procedure PT Treatments $Gait Training: 23-37 mins   PT G Codes:         Janice Orr, [place] 10/31/2013, 3:23 PM

## 2013-10-31 NOTE — Progress Notes (Signed)
Patient complains of increasing nausea unrelieved by Zofran and Reglan.  Patient reports poor oral intake due to nausea.  Patient also feels as though she has chills and both cheeks are flushed.  Vital signs are T- 98.8 F (oral), BP - 116/53, HR-79, RR-18 and SpO2-98% on RA.  Patient reports pain 4/10 but declines pain medication at this time.  On call Loni Dolly, PA contacted.  Orders for Phenergan received.  Will continue to monitor patient closely.

## 2013-10-31 NOTE — Progress Notes (Signed)
Patient ID: Janice Orr, female   DOB: 04-23-1956, 58 y.o.   MRN: [id number] PATIENT ID: Janice Orr  MRN: [id number]  DOB/AGE:  Nov 21, 1955 / 58 y.o.  1 Day Post-Op Procedure(s) (LRB): RIGHT TOTAL HIP ARTHROPLASTY (Right)    PROGRESS NOTE Subjective: Patient is alert, oriented,no Nausea, no Vomiting, yes passing gas, no Bowel Movement. Taking PO well. Denies SOB, Chest or Calf Pain. Using Incentive Spirometer, PAS in place. Ambulate WBAT Patient reports pain as 2 on 0-10 scale  .    Objective: Vital signs in last 24 hours: Filed Vitals:   10/31/13 0000 10/31/13 0149 10/31/13 0400 10/31/13 0657  BP:  131/71  141/81  Pulse:  72  83  Temp:  98.6 F (37 C)  98.7 F (37.1 C)  TempSrc:  Oral  Oral  Resp: 18 18 18 18   Weight:      SpO2: 100% 99% 99% 98%      Intake/Output from previous day: I/O last 3 completed shifts: In: 4705 [P.O.:530; I.V.:4175] Out: 300 [Blood:300]   Intake/Output this shift:     LABORATORY DATA:  Recent Labs  10/31/13 0530  WBC 9.6  HGB 10.4*  HCT 32.1*  PLT 247  NA 139  K 4.2  CL 100  CO2 25  BUN 10  CREATININE 0.81  GLUCOSE 115*  CALCIUM 8.6    Examination: Neurologically intact ABD soft Neurovascular intact Sensation intact distally Intact pulses distally Dorsiflexion/Plantar flexion intact Incision: dressing C/D/I No cellulitis present Compartment soft} XR AP&Lat of hip shows well placed\fixed THA  Assessment:   1 Day Post-Op Procedure(s) (LRB): RIGHT TOTAL HIP ARTHROPLASTY (Right) ADDITIONAL DIAGNOSIS:    Plan: PT/OT WBAT, THA  posterior precautions  DVT Prophylaxis: SCDx72 hrs, ASA 325 mg BID x 2 weeks  DISCHARGE PLAN: Home, when passes PT  DISCHARGE NEEDS: HHPT, HHRN, CPM, Walker and 3-in-1 comode seat

## 2013-10-31 NOTE — Progress Notes (Signed)
Occupational Therapy Evaluation and Discharge Patient Details Name: Janice Orr MRN: [id number] DOB: [birth year] Today's Date: 10/31/2013    History of Present Illness 58 yo female who underwent a posterior approach THR 10-30-13.  Past history include hypertension, anxiety, back surgery   Clinical Impression   PTA pt lived at home with husband and was independent with ADLs and functional mobility. Educated pt and family on fall prevention techniques, safety with DME and RW, energy conservation principles, and compensatory techniques for LB ADLs. Pt reports that family will be home 24/7 to assist with ADLs as needed. No further acute OT needs. Acute OT to sign off.     Follow Up Recommendations  No OT follow up;Supervision/Assistance - 24 hour    Equipment Recommendations  3 in 1 bedside comode       Precautions / Restrictions Precautions Precautions: Posterior Hip Precaution Booklet Issued: Yes (comment) Precaution Comments: Educated pt on 3/3 posterior hip precautions and incorporating into ADLs Restrictions Weight Bearing Restrictions: Yes RLE Weight Bearing: Weight bearing as tolerated      Mobility Bed Mobility Overal bed mobility: Needs Assistance Bed Mobility: Supine to Sit     Supine to sit: HOB elevated;Min guard (use of bed rails, pt able to manage RLE off bed)     General bed mobility comments: Educated pt on use of leg lifter/tie/belt for improved bed mobility.   Transfers Overall transfer level: Needs assistance Equipment used: Rolling walker (2 wheeled) Transfers: Sit to/from [facility] Sit to Stand: Min guard Stand pivot transfers: Min guard       General transfer comment: Pt able to power up without assist and showed improved sit>stand with LUE on walker and RUE on seated surface. Min VC's for hip precautions.    Balance Overall balance assessment: Needs assistance Sitting-balance support: No upper extremity supported;Feet  supported Sitting balance-Leahy Scale: Good     Standing balance support: Bilateral upper extremity supported;During functional activity Standing balance-Leahy Scale: Poor                              ADL Overall ADL's : Needs assistance/impaired Eating/Feeding: Independent;Sitting   Grooming: Min guard;Standing   Upper Body Bathing: Set up;Sitting   Lower Body Bathing: Moderate assistance;Sit to/from stand;Adhering to hip precautions   Upper Body Dressing : Set up;Sitting   Lower Body Dressing: Maximal assistance;Sit to/from stand;Adhering to hip precautions   Toilet Transfer: Min guard;Ambulation;RW;Stand-pivot (from bed>recliner)   Toileting- Clothing Manipulation and Hygiene: Minimal assistance;Adhering to hip precautions;Sit to/from stand   Tub/ Shower Transfer: Walk-in shower;Min guard;Ambulation;3 in 1;Rolling walker   Functional mobility during ADLs: Min guard;Rolling walker General ADL Comments: Pt motivated to return home with 24/7 assist from husband and son. Pt Min guard for transfers and functional mobility and requires assist for LB ADLs. Educated pt and family on fall prevention strategies, safe use of DME and RW, and energy conservation principles as well as compensatory techniques for LB ADLs. Demonstrated shower transfer with pt stating understanding.      Vision  Per pt report, no change from baseline.                   Perception Perception Perception Tested?: No   Praxis Praxis Praxis tested?: Within functional limits    Pertinent Vitals/Pain 8/10 in R surgical hip site, RN notified. Pt repositioned in chair with ice pack on R hip surgical site for increased comfort.  Hand Dominance Left   Extremity/Trunk Assessment Upper Extremity Assessment Upper Extremity Assessment: Overall WFL for tasks assessed   Lower Extremity Assessment Lower Extremity Assessment: Defer to PT evaluation   Cervical / Trunk Assessment Cervical  / Trunk Assessment: Normal   Communication Communication Communication: No difficulties   Cognition Arousal/Alertness: Awake/alert Behavior During Therapy: WFL for tasks assessed/performed Overall Cognitive Status: Within Functional Limits for tasks assessed                        Exercises Exercises: Other exercises Other Exercises Other Exercises: encouraged pt to perform chair push ups to improve tricep strength for use of RW        Home Living Family/patient expects to be discharged to:: Private residence Living Arrangements: Spouse/significant other Available Help at Discharge: Family;Available 24 hours/day (husband and son) Type of Home: House Home Access: Stairs to enter [employer] of Steps: 2 (from garage) Entrance Stairs-Rails: None Home Layout: Two level Alternate Level Stairs-Number of Steps: 13   Bathroom Shower/Tub: Occupational psychologist: Standard     Home Equipment: None          Prior Functioning/Environment Level of Independence: Independent              End of Session Equipment Utilized During Treatment: Gait belt;Rolling walker  Activity Tolerance: Patient tolerated treatment well Patient left: in chair;with call bell/phone within reach;with family/visitor present   Time: 1348-1410 OT Time Calculation (min): 22 min Charges:  OT General Charges $OT Visit: 1 Procedure OT Evaluation $Initial OT Evaluation Tier I: 1 Procedure OT Treatments $Self Care/Home Management : 8-22 mins  Juluis Rainier [phone] 10/31/2013, 2:27 PM

## 2013-10-31 NOTE — Plan of Care (Signed)
Problem: Consults Goal: Diagnosis- Total Joint Replacement Primary Total Hip Right     

## 2013-11-01 LAB — CBC
HCT: 32.4 % — ABNORMAL LOW (ref 36.0–46.0)
Hemoglobin: 10.5 g/dL — ABNORMAL LOW (ref 12.0–15.0)
MCH: 30.9 pg (ref 26.0–34.0)
MCHC: 32.4 g/dL (ref 30.0–36.0)
MCV: 95.3 fL (ref 78.0–100.0)
PLATELETS: 230 10*3/uL (ref 150–400)
RBC: 3.4 MIL/uL — ABNORMAL LOW (ref 3.87–5.11)
RDW: 12.8 % (ref 11.5–15.5)
WBC: 9 10*3/uL (ref 4.0–10.5)

## 2013-11-01 MED ORDER — ACETAMINOPHEN 10 MG/ML IV SOLN
1000.0000 mg | Freq: Once | INTRAVENOUS | Status: AC
  Administered 2013-11-01: 1000 mg via INTRAVENOUS
  Filled 2013-11-01: qty 100

## 2013-11-01 NOTE — Progress Notes (Signed)
PATIENT ID: Janice Orr  MRN: [id number]  DOB/AGE:  December 13, 1955 / 58 y.o.  2 Days Post-Op Procedure(s) (LRB): RIGHT TOTAL HIP ARTHROPLASTY (Right)    PROGRESS NOTE Subjective: Patient is alert, oriented,no Nausea, no Vomiting, yes passing gas, no Bowel Movement. Taking PO well. Denies SOB, Chest or Calf Pain. Using Incentive Spirometer, PAS in place. Ambulate WBAT, with pt doing steps yesterday. Patient reports pain as moderate  .    Objective: Vital signs in last 24 hours: Filed Vitals:   10/31/13 1956 10/31/13 2000 11/01/13 0000 11/01/13 0427  BP: 116/53   101/50  Pulse: 79     Temp: 98.8 F (37.1 C)     TempSrc: Oral     Resp: 18 18 18    Weight:      SpO2: 98% 98% 98%       Intake/Output from previous day: I/O last 3 completed shifts: In: 2875 [P.O.:1200; I.V.:1675] Out: 1 [Urine:1]   Intake/Output this shift:     LABORATORY DATA:  Recent Labs  10/31/13 0530 11/01/13 0430  WBC 9.6 9.0  HGB 10.4* 10.5*  HCT 32.1* 32.4*  PLT 247 230  NA 139  --   K 4.2  --   CL 100  --   CO2 25  --   BUN 10  --   CREATININE 0.81  --   GLUCOSE 115*  --   CALCIUM 8.6  --     Examination: Neurologically intact Neurovascular intact Sensation intact distally Intact pulses distally Dorsiflexion/Plantar flexion intact Incision: dressing C/D/I and no drainage No cellulitis present Compartment soft} XR AP&Lat of hip shows well placed\fixed THA  Assessment:   2 Days Post-Op Procedure(s) (LRB): RIGHT TOTAL HIP ARTHROPLASTY (Right) ADDITIONAL DIAGNOSIS:    Plan: PT/OT WBAT, THA  posterior precautions  DVT Prophylaxis: SCDx72 hrs, ASA 325 mg BID x 2 weeks  DISCHARGE PLAN: Home when pt passes PT goals  DISCHARGE NEEDS: HHPT, HHRN, Walker and 3-in-1 comode seat

## 2013-11-01 NOTE — Progress Notes (Signed)
Patient refuses to take oral pain medication, she feels that this is contributing to her nausea.  Patient's nausea was relieved with the 12.5 mg of IV Phenergan.  On call Loni Dolly, PA contacted for additional pain medication options.  Patient states pain is 8/10, unable to give IV Dilaudid at this time due to BP 101/50.  One time order to give Tylenol 1,000 mg IV until patient is reevaluated during am rounds by provider.  Will continue to monitor patient.

## 2013-11-01 NOTE — Care Management Note (Signed)
CARE MANAGEMENT NOTE 11/01/2013  Patient:  Janice Orr, Janice Orr   Account Number:  [id number]  Date Initiated:  11/01/2013  Documentation initiated by:  Ricki Miller  Subjective/Objective Assessment:   58 yr old female s/p right total hip arthroplasty.     Action/Plan:   case manager spoke with patient and family concerning home health and DME needs at discharge. Choice offered. Referral called to [facility].   Anticipated DC Date:  11/01/2013   Anticipated DC Plan:  [place]  CM consult      [facility] Choice  HOME HEALTH  DURABLE MEDICAL EQUIPMENT   Choice offered to / List presented to:  C-1 Patient   DME arranged  3-N-1  [place]      DME agency  [employer]     HH arranged  HH-2 PT      [place].   Status of service:  Completed, signed off Medicare Important Message given?   (If response is "NO", the following Medicare IM given date fields will be blank) Date Medicare IM given:   Date Additional Medicare IM given:    Discharge Disposition:  [place]  Per UR Regulation:    If discussed at Long Length of Stay Meetings, dates discussed:    Comments:

## 2013-11-01 NOTE — Progress Notes (Signed)
Physical Therapy Treatment Patient Details Name: Janice Orr MRN: [id number] DOB: 1956-05-16 Today's Date: 11/01/2013    History of Present Illness 58 yo female who underwent a posterior approach THR 10-30-13.  Past history include hypertension, anxiety, back surgery    PT Comments    Pt tolerated treatment well, ambulating up to 100 feet with supervision and has safely completed stair training. She is able to recall 3/3 posterior hip precautions. She is adequate for d/c home from PT standpoint and will benefit from continued HHPT services to further increase her level of independence with functional mobility.  Follow Up Recommendations  Home health PT     Equipment Recommendations  None recommended by PT    Recommendations for Other Services       Precautions / Restrictions Precautions Precautions: Posterior Hip Precaution Comments: Reviewed recalls 3/3 precautions for posterior hip Restrictions Weight Bearing Restrictions: Yes RLE Weight Bearing: Weight bearing as tolerated    Mobility  Bed Mobility Overal bed mobility: Needs Assistance Bed Mobility: Supine to Sit     Supine to sit: Supervision     General bed mobility comments: Supervision for safety. Cues for technique. follows precautions well.  Transfers Overall transfer level: Needs assistance Equipment used: Rolling walker (2 wheeled) Transfers: Sit to/from Stand Sit to Stand: Supervision         General transfer comment: Supervision for safety with verbal cues for hand placement. Performed from lowest bed setting, toilet (with cues to use grab bar and be mindful of precautions), and recliner x2.   Ambulation/Gait Ambulation/Gait assistance: Supervision Ambulation Distance (Feet): 100 Feet (additional bout of 20 feet to and from restroom) Assistive device: Rolling walker (2 wheeled) Gait Pattern/deviations: Step-through pattern;Decreased step length - left;Decreased stance time - right;Antalgic Gait  velocity: decreased   General Gait Details: Gait training focused on increasing left step length to increase stance time on R. Discussed turning to while preventing breaking of hip precautions. Supervision for safety, as pt controls RW well. Cues for forward gaze while ambulating.   Stairs Stairs: Yes Stairs assistance: Min guard Stair Management: No rails;One rail Right;Step to pattern;Backwards;Sideways;With walker Number of Stairs: 2 (x2) General stair comments: Performed stair training with backwards approach using RW with cues for sequencing min guard for safety and to block RW. Pt needed several reminders of which foot to step up/down with but was able to recall correctly towards end of training. Performed additional bout of ascend/descend with one rail on R similar to indoor stairs she will need to navigate upon d/c. Pt demonstrated this well and safely. She has no further questions at this time but states she plans to stay on the bottom floor of her home until she works with [employer].  Wheelchair Mobility    Modified Rankin (Stroke Patients Only)       Balance Overall balance assessment: Needs assistance Sitting-balance support: No upper extremity supported;Feet supported Sitting balance-Leahy Scale: Good Sitting balance - Comments: sits EOB without assist, good limits of stability   Standing balance support: No upper extremity supported;During functional activity Standing balance-Leahy Scale: Fair Standing balance comment: able to reach for rail on stairs without UE support from RW or rail.                    Cognition Arousal/Alertness: Awake/alert Behavior During Therapy: WFL for tasks assessed/performed Overall Cognitive Status: Within Functional Limits for tasks assessed  Exercises Total Joint Exercises Ankle Circles/Pumps: AROM;Both;10 reps;Seated    General Comments General comments (skin integrity, edema, etc.): Reviewed  posterior hip precautions and stair sequencing. Able to recall 3/3 precautions at end of therapy session. Reviewed safe exercises for posterior hip.      Pertinent Vitals/Pain Pt states her pain is "not bad" this AM; no numerical value given Patient repositioned in chair for comfort.     Home Living                      Prior Function            PT Goals (current goals can now be found in the care plan section) Acute Rehab PT Goals PT Goal Formulation: With patient Time For Goal Achievement: 11/07/13 Potential to Achieve Goals: Good Progress towards PT goals: Progressing toward goals    Frequency  7X/week    PT Plan Current plan remains appropriate    Co-evaluation             End of Session Equipment Utilized During Treatment: Gait belt Activity Tolerance: Patient tolerated treatment well Patient left: in chair;with call bell/phone within reach     Time: 5009-3818 PT Time Calculation (min): 23 min  Charges:  $Gait Training: 8-22 mins $Self Care/Home Management: 8-22                    G Codes:      Elayne Snare, [place]  Ellouise Newer 11/01/2013, 10:43 AM

## 2013-11-01 NOTE — Discharge Summary (Signed)
Patient ID: Janice Orr MRN: [id number] DOB/AGE: Mar 30, 1956 58 y.o.  Admit date: 10/30/2013 Discharge date: 11/01/2013  Admission Diagnoses:  Principal Problem:   Arthritis of right hip   Discharge Diagnoses:  Same  Past Medical History  Diagnosis Date  . PONV (postoperative nausea and vomiting)   . Family history of anesthesia complication     father hard to wake up and N/V  . H/O hiatal hernia   . Shortness of breath     states its from anxiety & allergies  . Hypertension   . Anxiety   . Depression   . Anemia     h/o of....none at the moment  . Arthritis     Surgeries: Procedure(s): RIGHT TOTAL HIP ARTHROPLASTY on 10/30/2013   Consultants:    Discharged Condition: Improved  Hospital Course: Janice Orr is an 58 y.o. female who was admitted 10/30/2013 for operative treatment ofArthritis of right hip. Patient has severe unremitting pain that affects sleep, daily activities, and work/hobbies. After pre-op clearance the patient was taken to the operating room on 10/30/2013 and underwent  Procedure(s): RIGHT TOTAL HIP ARTHROPLASTY.    Patient was given perioperative antibiotics: Anti-infectives   Start     Dose/Rate Route Frequency Ordered Stop   10/30/13 1015  ceFAZolin (ANCEF) IVPB 2 g/50 mL premix     2 g 100 mL/hr over 30 Minutes Intravenous  Once 10/30/13 1014 10/30/13 0945   10/29/13 0756  ceFAZolin (ANCEF) IVPB 2 g/50 mL premix  Status:  Discontinued     2 g 100 mL/hr over 30 Minutes Intravenous On call to O.R. 10/29/13 0756 10/30/13 1432       Patient was given sequential compression devices, early ambulation, and chemoprophylaxis to prevent DVT.  Patient benefited maximally from hospital stay and there were no complications.    Recent vital signs: Patient Vitals for the past 24 hrs:  BP Temp Temp src Pulse Resp SpO2  11/01/13 0956 101/50 mmHg - - - - -  11/01/13 0427 101/50 mmHg - - - - -  11/01/13 0000 - - - - 18 98 %  10/31/13 2000 - - - - 18 98 %   10/31/13 1956 116/53 mmHg 98.8 F (37.1 C) Oral 79 18 98 %  10/31/13 1642 125/57 mmHg 98.9 F (37.2 C) Oral 79 20 100 %     Recent laboratory studies:  Recent Labs  10/31/13 0530 11/01/13 0430  WBC 9.6 9.0  HGB 10.4* 10.5*  HCT 32.1* 32.4*  PLT 247 230  NA 139  --   K 4.2  --   CL 100  --   CO2 25  --   BUN 10  --   CREATININE 0.81  --   GLUCOSE 115*  --   CALCIUM 8.6  --      Discharge Medications:     Medication List         aspirin EC 325 MG tablet  Take 1 tablet (325 mg total) by mouth 2 (two) times daily.     desonide 0.05 % ointment  Commonly known as:  DESOWEN  Apply 1 application topically 2 (two) times daily.     DEXILANT 60 MG capsule  Generic drug:  dexlansoprazole  Take 60 mg by mouth daily.     docusate sodium 100 MG capsule  Commonly known as:  COLACE  Take 100 mg by mouth daily.     fexofenadine-pseudoephedrine 60-120 MG per tablet  Commonly known as:  ALLEGRA-D  Take  1 tablet by mouth 2 (two) times daily.     FLUoxetine 40 MG capsule  Commonly known as:  PROZAC  Take 40 mg by mouth daily.     LORazepam 0.5 MG tablet  Commonly known as:  ATIVAN  Take 0.5 mg by mouth every 8 (eight) hours as needed for anxiety.     losartan 50 MG tablet  Commonly known as:  COZAAR  Take 50 mg by mouth daily.     methocarbamol 500 MG tablet  Commonly known as:  ROBAXIN  1 tablet q 6hr prn muscle spasm     metoprolol succinate 25 MG 24 hr tablet  Commonly known as:  TOPROL-XL  Take 25 mg by mouth daily.     oxyCODONE-acetaminophen 5-325 MG per tablet  Commonly known as:  ROXICET  Take 1-2 tablets by mouth every 4 (four) hours as needed.     simvastatin 20 MG tablet  Commonly known as:  ZOCOR  Take 20 mg by mouth daily.     zolpidem 10 MG tablet  Commonly known as:  AMBIEN  Take 10 mg by mouth at bedtime as needed for sleep.        Diagnostic Studies: Dg Chest 2 View  10/26/2013   CLINICAL DATA:  Pre-admission for hip surgery.  EXAM:  CHEST  2 VIEW  COMPARISON:  07/05/2007  FINDINGS: Cardiac silhouette is normal in size. There is a moderate to large hiatal hernia. No other mediastinal abnormalities. No hilar masses. No evidence of adenopathy.  Clear lungs.  No pleural effusion.  No pneumothorax.  There are old, healed right-sided rib fractures.  IMPRESSION: No active cardiopulmonary disease.   Electronically Signed   By: Lajean Manes M.D.   On: 10/26/2013 09:14   Dg Pelvis Portable  10/30/2013   CLINICAL DATA:  Right hip replacement.  EXAM: PORTABLE PELVIS 1-2 VIEWS  COMPARISON:  DG HIP COMPLETE 2+V*R* dated 07/31/2013  FINDINGS: Patient status post total right hip replacement. Good anatomic alignment on AP view. No acute bony abnormality. Pelvic phleboliths.  IMPRESSION: Total right hip replacement with good anatomic alignment on AP view.   Electronically Signed   By: Marcello Moores  Register   On: 10/30/2013 13:29    Disposition: Final discharge disposition not confirmed      Discharge Orders   Future Orders Complete By Expires   Call MD / Call 911  As directed    Change dressing  As directed    Constipation Prevention  As directed    Diet - low sodium heart healthy  As directed    Discharge instructions  As directed    Driving restrictions  As directed    Follow the hip precautions as taught in Physical Therapy  As directed    Increase activity slowly as tolerated  As directed    Patient may shower  As directed       Follow-up Information   Follow up with [facility]. (Someone from [facility] will contact you concerning start date and time for physical therapy.)    Contact information:   [address] [place] [address] [phone]       Follow up with Kerin Salen, MD In 2 weeks.   Specialty:  Orthopedic Surgery   Contact information:   [place] [address] [phone]        Signed: Leighton Parody 11/01/2013, 12:11 PM

## 2013-12-12 ENCOUNTER — Encounter (HOSPITAL_COMMUNITY): Payer: Self-pay | Admitting: Emergency Medicine

## 2013-12-12 ENCOUNTER — Emergency Department (HOSPITAL_COMMUNITY)
Admission: EM | Admit: 2013-12-12 | Discharge: 2013-12-12 | Disposition: A | Discharge status: Home / Self Care | Payer: 59 | Attending: Emergency Medicine | Admitting: Emergency Medicine

## 2013-12-12 DIAGNOSIS — R5381 Other malaise: Secondary | ICD-10-CM | POA: Insufficient documentation

## 2013-12-12 DIAGNOSIS — I10 Essential (primary) hypertension: Secondary | ICD-10-CM | POA: Insufficient documentation

## 2013-12-12 DIAGNOSIS — R7309 Other abnormal glucose: Secondary | ICD-10-CM | POA: Insufficient documentation

## 2013-12-12 DIAGNOSIS — R739 Hyperglycemia, unspecified: Secondary | ICD-10-CM

## 2013-12-12 DIAGNOSIS — R51 Headache: Secondary | ICD-10-CM | POA: Insufficient documentation

## 2013-12-12 DIAGNOSIS — R112 Nausea with vomiting, unspecified: Secondary | ICD-10-CM | POA: Insufficient documentation

## 2013-12-12 DIAGNOSIS — M129 Arthropathy, unspecified: Secondary | ICD-10-CM | POA: Insufficient documentation

## 2013-12-12 DIAGNOSIS — R109 Unspecified abdominal pain: Secondary | ICD-10-CM | POA: Insufficient documentation

## 2013-12-12 DIAGNOSIS — F329 Major depressive disorder, single episode, unspecified: Secondary | ICD-10-CM | POA: Insufficient documentation

## 2013-12-12 DIAGNOSIS — F3289 Other specified depressive episodes: Secondary | ICD-10-CM | POA: Insufficient documentation

## 2013-12-12 DIAGNOSIS — R5383 Other fatigue: Principal | ICD-10-CM

## 2013-12-12 DIAGNOSIS — Z79899 Other long term (current) drug therapy: Secondary | ICD-10-CM | POA: Insufficient documentation

## 2013-12-12 DIAGNOSIS — R531 Weakness: Secondary | ICD-10-CM

## 2013-12-12 DIAGNOSIS — K5900 Constipation, unspecified: Secondary | ICD-10-CM | POA: Insufficient documentation

## 2013-12-12 DIAGNOSIS — Z7982 Long term (current) use of aspirin: Secondary | ICD-10-CM | POA: Insufficient documentation

## 2013-12-12 DIAGNOSIS — R52 Pain, unspecified: Secondary | ICD-10-CM | POA: Insufficient documentation

## 2013-12-12 DIAGNOSIS — IMO0002 Reserved for concepts with insufficient information to code with codable children: Secondary | ICD-10-CM | POA: Insufficient documentation

## 2013-12-12 DIAGNOSIS — F411 Generalized anxiety disorder: Secondary | ICD-10-CM | POA: Insufficient documentation

## 2013-12-12 DIAGNOSIS — Z8709 Personal history of other diseases of the respiratory system: Secondary | ICD-10-CM | POA: Insufficient documentation

## 2013-12-12 DIAGNOSIS — Z862 Personal history of diseases of the blood and blood-forming organs and certain disorders involving the immune mechanism: Secondary | ICD-10-CM | POA: Insufficient documentation

## 2013-12-12 DIAGNOSIS — E860 Dehydration: Secondary | ICD-10-CM | POA: Insufficient documentation

## 2013-12-12 LAB — CBC WITH DIFFERENTIAL/PLATELET
BASOS ABS: 0 10*3/uL (ref 0.0–0.1)
BASOS PCT: 0 % (ref 0–1)
EOS ABS: 0 10*3/uL (ref 0.0–0.7)
EOS PCT: 0 % (ref 0–5)
HEMATOCRIT: 38.6 % (ref 36.0–46.0)
Hemoglobin: 12.8 g/dL (ref 12.0–15.0)
Lymphocytes Relative: 12 % (ref 12–46)
Lymphs Abs: 1.1 10*3/uL (ref 0.7–4.0)
MCH: 30.6 pg (ref 26.0–34.0)
MCHC: 33.2 g/dL (ref 30.0–36.0)
MCV: 92.3 fL (ref 78.0–100.0)
Monocytes Absolute: 0.5 10*3/uL (ref 0.1–1.0)
Monocytes Relative: 5 % (ref 3–12)
Neutro Abs: 7.6 10*3/uL (ref 1.7–7.7)
Neutrophils Relative %: 83 % — ABNORMAL HIGH (ref 43–77)
Platelets: 310 10*3/uL (ref 150–400)
RBC: 4.18 MIL/uL (ref 3.87–5.11)
RDW: 12.3 % (ref 11.5–15.5)
WBC: 9.2 10*3/uL (ref 4.0–10.5)

## 2013-12-12 LAB — COMPREHENSIVE METABOLIC PANEL
ALT: 14 U/L (ref 0–35)
AST: 17 U/L (ref 0–37)
Albumin: 3.9 g/dL (ref 3.5–5.2)
Alkaline Phosphatase: 98 U/L (ref 39–117)
BUN: 13 mg/dL (ref 6–23)
CALCIUM: 9.6 mg/dL (ref 8.4–10.5)
CO2: 21 mEq/L (ref 19–32)
CREATININE: 0.91 mg/dL (ref 0.50–1.10)
Chloride: 99 mEq/L (ref 96–112)
GFR calc Af Amer: 78 mL/min — ABNORMAL LOW (ref >90)
GFR calc non Af Amer: 68 mL/min — ABNORMAL LOW (ref >90)
Glucose, Bld: 128 mg/dL — ABNORMAL HIGH (ref 70–99)
Potassium: 4 mEq/L (ref 3.7–5.3)
Sodium: 138 mEq/L (ref 137–147)
Total Bilirubin: 0.4 mg/dL (ref 0.3–1.2)
Total Protein: 6.9 g/dL (ref 6.0–8.3)

## 2013-12-12 LAB — URINALYSIS, ROUTINE W REFLEX MICROSCOPIC
Bilirubin Urine: NEGATIVE
GLUCOSE, UA: NEGATIVE mg/dL
Hgb urine dipstick: NEGATIVE
Ketones, ur: 15 mg/dL — AB
Nitrite: NEGATIVE
Protein, ur: NEGATIVE mg/dL
Specific Gravity, Urine: 1.014 (ref 1.005–1.030)
UROBILINOGEN UA: 1 mg/dL (ref 0.0–1.0)
pH: 7.5 (ref 5.0–8.0)

## 2013-12-12 LAB — URINE MICROSCOPIC-ADD ON

## 2013-12-12 LAB — PREGNANCY, URINE: Preg Test, Ur: NEGATIVE

## 2013-12-12 MED ORDER — POLYETHYLENE GLYCOL 3350 17 G PO PACK
17.0000 g | PACK | Freq: Every day | ORAL | Status: DC
  Filled 2013-12-12: qty 1

## 2013-12-12 MED ORDER — ONDANSETRON HCL 4 MG/2ML IJ SOLN
4.0000 mg | Freq: Once | INTRAMUSCULAR | Status: DC
  Filled 2013-12-12: qty 2

## 2013-12-12 MED ORDER — SODIUM CHLORIDE 0.9 % IV BOLUS (SEPSIS)
1000.0000 mL | INTRAVENOUS | Status: AC
  Administered 2013-12-12: 1000 mL via INTRAVENOUS

## 2013-12-12 NOTE — ED Notes (Addendum)
Pt was sent by sports med doctor today for further evaluation of feeling lightheaded in office at PT today. She had hip replacement surgery 6 weeks ago and she states shes not been eating well and she has had constipation all week. She has also been taking decongestants OTC for sinus symptoms. Ems started an IV and administered NS and she states that has made her feel better. She states she is aching all over

## 2013-12-12 NOTE — ED Notes (Addendum)
Pt has multiple general complaints. Pt c/o abdominal bloating, nausea, and no BM in 3-4 days. Abdomen mildly distended. Patient also c/o mild ShOB at rest. SpO2 100% on room air. Patient ahd Dr. Appointment this AM for 6 week check up from rt hip replacement and went to physical therapy after. Reports getting dizzy and lightheaded at physical therapy. Pt husband reports that vitals at PT were HR 125 and BP 95/80. PT started IV and gave 545ml bolus. Pt in NAD at this time.

## 2013-12-12 NOTE — ED Provider Notes (Signed)
CSN: [id number]     Arrival date & time 12/12/13  1244 History   First MD Initiated Contact with Patient 12/12/13 1326     Chief Complaint  Patient presents with  . Generalized Body Aches  . Weakness     (Consider location/radiation/quality/duration/timing/severity/associated sxs/prior Treatment) Patient is a 58 y.o. female presenting with weakness.  Weakness Associated symptoms include abdominal pain ( "gasy/bloated"), congestion, fatigue, headaches ( sinus), nausea and weakness. Pertinent negatives include no chills, fever, numbness, sore throat or vomiting.   Pt is a 57yo female with hx of HTN, anxiety, depression, and anemia presenting to ED with c/o generalized weakness and near syncope. Pt was being seen by sports medicine doctor for f/u of right hip replacement performed on 10/30/13 when she began to feel lightheaded.  Pt states she has not been eating well and has had constipation all week.  Pt has also been taking decongestant OTCs for sinus congestion and dry cough for last week.  EMS was called to PT office and started IV fluids which pt states made her feel much better.  Upon arrival to ED pt states she feels aching all over and feels warm all over. States she feels lightheaded and if she were to stand up, she would possibly pass out. Denies hx of same.    Past Medical History  Diagnosis Date  . PONV (postoperative nausea and vomiting)   . Family history of anesthesia complication     father hard to wake up and N/V  . H/O hiatal hernia   . Shortness of breath     states its from anxiety & allergies  . Hypertension   . Anxiety   . Depression   . Anemia     h/o of....none at the moment  . Arthritis    Past Surgical History  Procedure Laterality Date  . Back surgery      lumbar  . Cholecystectomy    . Mohs surgery    . Total hip arthroplasty Right 10/30/2013    DR Mayer Camel  . Total hip arthroplasty Right 10/30/2013    Procedure: RIGHT TOTAL HIP ARTHROPLASTY;  Surgeon: Kerin Salen, MD;  Location: [facility];  Service: Orthopedics;  Laterality: Right;   History reviewed. No pertinent family history. History  Substance Use Topics  . Smoking status: Never Smoker   . Smokeless tobacco: Never Used  . Alcohol Use: No     Comment: occasiona   OB History   Grav Para Term Preterm Abortions TAB SAB Ect Mult Living                 Review of Systems  Constitutional: Positive for appetite change and fatigue. Negative for fever and chills.  HENT: Positive for congestion. Negative for ear pain, sore throat, trouble swallowing and voice change.   Gastrointestinal: Positive for nausea, abdominal pain ( "gasy/bloated"), constipation and abdominal distention. Negative for vomiting, diarrhea and blood in stool.  Genitourinary: Negative for dysuria, frequency and hematuria.  Neurological: Positive for dizziness, weakness, light-headedness and headaches ( sinus). Negative for numbness.  All other systems reviewed and are negative.     Allergies  Claritin-d 12 hour and Codeine  Home Medications   Prior to Admission medications   Medication Sig Start Date End Date Taking? Authorizing Provider  aspirin EC 325 MG tablet Take 1 tablet (325 mg total) by mouth 2 (two) times daily. 10/31/13  Yes Kerin Salen, MD  desonide (DESOWEN) 0.05 % ointment Apply 1 application topically  2 (two) times daily.   Yes Historical Provider, MD  dexlansoprazole (DEXILANT) 60 MG capsule Take 60 mg by mouth daily.   Yes Historical Provider, MD  docusate sodium (COLACE) 100 MG capsule Take 100 mg by mouth daily.   Yes Historical Provider, MD  FLUoxetine (PROZAC) 40 MG capsule Take 40 mg by mouth daily.   Yes Historical Provider, MD  LORazepam (ATIVAN) 0.5 MG tablet Take 0.5 mg by mouth every 8 (eight) hours as needed for anxiety.   Yes Historical Provider, MD  losartan (COZAAR) 50 MG tablet Take 50 mg by mouth daily.   Yes Historical Provider, MD  methocarbamol (ROBAXIN) 500 MG tablet Take 500 mg by  mouth every 6 (six) hours as needed for muscle spasms. 1 tablet q 6hr prn muscle spasm 10/31/13  Yes Kerin Salen, MD  metoprolol succinate (TOPROL-XL) 25 MG 24 hr tablet Take 25 mg by mouth daily.   Yes Historical Provider, MD  oxyCODONE-acetaminophen (ROXICET) 5-325 MG per tablet Take 1-2 tablets by mouth every 4 (four) hours as needed. 10/31/13  Yes Kerin Salen, MD  simvastatin (ZOCOR) 20 MG tablet Take 20 mg by mouth daily.   Yes Historical Provider, MD  zolpidem (AMBIEN) 10 MG tablet Take 10 mg by mouth at bedtime as needed for sleep.   Yes Historical Provider, MD   BP 114/67  Pulse 96  Temp(Src) 98 F (36.7 C) (Oral)  Resp 17  SpO2 100% Physical Exam  Nursing note and vitals reviewed. Constitutional: She is oriented to person, place, and time. She appears well-developed and well-nourished.  Pt lying in darkened room with damp washcloth over head and ice pack on her neck.  Appears fatigued but alert.  HENT:  Head: Normocephalic and atraumatic.  Right Ear: Hearing, tympanic membrane, external ear and ear canal normal.  Left Ear: Hearing, tympanic membrane, external ear and ear canal normal.  Nose: Mucosal edema present. Right sinus exhibits maxillary sinus tenderness. Right sinus exhibits no frontal sinus tenderness. Left sinus exhibits maxillary sinus tenderness. Left sinus exhibits no frontal sinus tenderness.  Mouth/Throat: Uvula is midline and mucous membranes are normal. Posterior oropharyngeal erythema present. No oropharyngeal exudate, posterior oropharyngeal edema or tonsillar abscesses.  Eyes: Conjunctivae are normal. No scleral icterus.  Neck: Normal range of motion.  Cardiovascular: Normal rate, regular rhythm and normal heart sounds.   Pulmonary/Chest: Effort normal and breath sounds normal. No respiratory distress. She has no wheezes. She has no rales. She exhibits no tenderness.  Abdominal: Soft. Bowel sounds are normal. She exhibits distension. She exhibits no mass. There  is tenderness. There is no rebound and no guarding.  Soft, mild distention, mild diffuse tenderness. No rebound, guarding or masses.  Musculoskeletal: Normal range of motion.  Neurological: She is alert and oriented to person, place, and time. GCS eye subscore is 4. GCS verbal subscore is 5. GCS motor subscore is 6.  Skin: Skin is warm and dry.    ED Course  Procedures (including critical care time) Labs Review Labs Reviewed  CBC WITH DIFFERENTIAL - Abnormal; Notable for the following:    Neutrophils Relative % 83 (*)    All other components within normal limits  COMPREHENSIVE METABOLIC PANEL - Abnormal; Notable for the following:    Glucose, Bld 128 (*)    GFR calc non Af Amer 68 (*)    GFR calc Af Amer 78 (*)    All other components within normal limits  URINALYSIS, ROUTINE W REFLEX MICROSCOPIC - Abnormal; Notable  for the following:    Ketones, ur 15 (*)    Leukocytes, UA SMALL (*)    All other components within normal limits  URINE MICROSCOPIC-ADD ON - Abnormal; Notable for the following:    Squamous Epithelial / LPF FEW (*)    Bacteria, UA FEW (*)    All other components within normal limits  PREGNANCY, URINE    Imaging Review No results found.   EKG Interpretation None      MDM   Final diagnoses:  Dehydration  Weakness  Body aches  Hyperglycemia    Pt is a 58yo female sent to ED from sport's medicine clinic for further evaluation of near syncope.  Symptoms likely due to dehydration as pt reports not eating much recently, taking decongestants and still taking her BP medication.  BP-was 95/80 via EMS and HR 125.  Pt given IV fluids which helped pt's symptoms.  Discussed pt with Dr. Christy Gentles who also examined pt, low concern for ACS or PE. Pt denies chest pain or SOB.   UA: mild ketones. CBC: unremarkable. CMP: elevated glucose as pt states she has not eaten all day, Glucose-128. No previous hx of DM.   4:11 PM Pt states she feels much better after zofran and IV  fluids. Pt reports having a small BM while in ED w/o using miralax.  Pt states she feels comfortable being discharged home. Advised to f/u with Dr. Sabra Heck. Return precautions provided. Pt verbalized understanding and agreement with tx plan.      Noland Fordyce, PA-C 12/12/13 1614

## 2013-12-12 NOTE — ED Notes (Signed)
Pt HR 110 with an o2 stat of 100% while ambulating

## 2013-12-12 NOTE — ED Provider Notes (Signed)
Date: 12/12/2013 1335  Rate: 111  Rhythm: sinus tachycardia  QRS Axis: normal  Intervals: normal  ST/T Wave abnormalities: nonspecific ST changes  Conduction Disutrbances:none  Narrative Interpretation:   Old EKG Reviewed: unchanged from 10/26/13     Sharyon Cable, MD 12/12/13 1341

## 2013-12-12 NOTE — Discharge Instructions (Signed)
Dehydration, Adult Dehydration is when you lose more fluids from the body than you take in. Vital organs like the kidneys, brain, and heart cannot function without a proper amount of fluids and salt. Any loss of fluids from the body can cause dehydration.  CAUSES   Vomiting.  Diarrhea.  Excessive sweating.  Excessive urine output.  Fever. SYMPTOMS  Mild dehydration  Thirst.  Dry lips.  Slightly dry mouth. Moderate dehydration  Very dry mouth.  Sunken eyes.  Skin does not bounce back quickly when lightly pinched and released.  Dark urine and decreased urine production.  Decreased tear production.  Headache. Severe dehydration  Very dry mouth.  Extreme thirst.  Rapid, weak pulse (more than 100 beats per minute at rest).  Cold hands and feet.  Not able to sweat in spite of heat and temperature.  Rapid breathing.  Blue lips.  Confusion and lethargy.  Difficulty being awakened.  Minimal urine production.  No tears. DIAGNOSIS  Your caregiver will diagnose dehydration based on your symptoms and your exam. Blood and urine tests will help confirm the diagnosis. The diagnostic evaluation should also identify the cause of dehydration. TREATMENT  Treatment of mild or moderate dehydration can often be done at home by increasing the amount of fluids that you drink. It is best to drink small amounts of fluid more often. Drinking too much at one time can make vomiting worse. Refer to the home care instructions below. Severe dehydration needs to be treated at the hospital where you will probably be given intravenous (IV) fluids that contain water and electrolytes. HOME CARE INSTRUCTIONS   Ask your caregiver about specific rehydration instructions.  Drink enough fluids to keep your urine clear or pale yellow.  Drink small amounts frequently if you have nausea and vomiting.  Eat as you normally do.  Avoid:  Foods or drinks high in sugar.  Carbonated  drinks.  Juice.  Extremely hot or cold fluids.  Drinks with caffeine.  Fatty, greasy foods.  Alcohol.  Tobacco.  Overeating.  Gelatin desserts.  Wash your hands well to avoid spreading bacteria and viruses.  Only take over-the-counter or prescription medicines for pain, discomfort, or fever as directed by your caregiver.  Ask your caregiver if you should continue all prescribed and over-the-counter medicines.  Keep all follow-up appointments with your caregiver. SEEK MEDICAL CARE IF:  You have abdominal pain and it increases or stays in one area (localizes).  You have a rash, stiff neck, or severe headache.  You are irritable, sleepy, or difficult to awaken.  You are weak, dizzy, or extremely thirsty. SEEK IMMEDIATE MEDICAL CARE IF:   You are unable to keep fluids down or you get worse despite treatment.  You have frequent episodes of vomiting or diarrhea.  You have blood or green matter (bile) in your vomit.  You have blood in your stool or your stool looks black and tarry.  You have not urinated in 6 to 8 hours, or you have only urinated a small amount of very dark urine.  You have a fever.  You faint. MAKE SURE YOU:   Understand these instructions.  Will watch your condition.  Will get help right away if you are not doing well or get worse. Document Released: 06/15/2005 Document Revised: 09/07/2011 Document Reviewed: 02/02/2011 [employer] Patient Information 2014 [employer].  

## 2013-12-13 ENCOUNTER — Emergency Department (HOSPITAL_COMMUNITY)
Admission: EM | Admit: 2013-12-13 | Discharge: 2013-12-13 | Disposition: A | Discharge status: Home / Self Care | Payer: 59 | Attending: Emergency Medicine | Admitting: Emergency Medicine

## 2013-12-13 DIAGNOSIS — I10 Essential (primary) hypertension: Secondary | ICD-10-CM | POA: Insufficient documentation

## 2013-12-13 DIAGNOSIS — R000 Tachycardia, unspecified: Secondary | ICD-10-CM | POA: Insufficient documentation

## 2013-12-13 DIAGNOSIS — R42 Dizziness and giddiness: Secondary | ICD-10-CM | POA: Insufficient documentation

## 2013-12-13 DIAGNOSIS — F3289 Other specified depressive episodes: Secondary | ICD-10-CM | POA: Insufficient documentation

## 2013-12-13 DIAGNOSIS — R5383 Other fatigue: Secondary | ICD-10-CM

## 2013-12-13 DIAGNOSIS — R110 Nausea: Secondary | ICD-10-CM

## 2013-12-13 DIAGNOSIS — F411 Generalized anxiety disorder: Secondary | ICD-10-CM | POA: Insufficient documentation

## 2013-12-13 DIAGNOSIS — K5900 Constipation, unspecified: Secondary | ICD-10-CM | POA: Insufficient documentation

## 2013-12-13 DIAGNOSIS — F329 Major depressive disorder, single episode, unspecified: Secondary | ICD-10-CM | POA: Insufficient documentation

## 2013-12-13 DIAGNOSIS — R197 Diarrhea, unspecified: Secondary | ICD-10-CM | POA: Insufficient documentation

## 2013-12-13 DIAGNOSIS — IMO0002 Reserved for concepts with insufficient information to code with codable children: Secondary | ICD-10-CM | POA: Insufficient documentation

## 2013-12-13 DIAGNOSIS — Z79899 Other long term (current) drug therapy: Secondary | ICD-10-CM | POA: Insufficient documentation

## 2013-12-13 DIAGNOSIS — R5381 Other malaise: Secondary | ICD-10-CM | POA: Insufficient documentation

## 2013-12-13 DIAGNOSIS — R0602 Shortness of breath: Secondary | ICD-10-CM | POA: Insufficient documentation

## 2013-12-13 DIAGNOSIS — M129 Arthropathy, unspecified: Secondary | ICD-10-CM | POA: Insufficient documentation

## 2013-12-13 LAB — URINALYSIS, ROUTINE W REFLEX MICROSCOPIC
BILIRUBIN URINE: NEGATIVE
Glucose, UA: NEGATIVE mg/dL
Ketones, ur: 15 mg/dL — AB
Leukocytes, UA: NEGATIVE
Nitrite: NEGATIVE
PROTEIN: NEGATIVE mg/dL
Specific Gravity, Urine: 1.009 (ref 1.005–1.030)
Urobilinogen, UA: 0.2 mg/dL (ref 0.0–1.0)
pH: 5.5 (ref 5.0–8.0)

## 2013-12-13 LAB — D-DIMER, QUANTITATIVE: D-Dimer, Quant: 0.33 ug/mL-FEU (ref 0.00–0.48)

## 2013-12-13 LAB — CBC WITH DIFFERENTIAL/PLATELET
Basophils Absolute: 0 10*3/uL (ref 0.0–0.1)
Basophils Relative: 0 % (ref 0–1)
Eosinophils Absolute: 0 10*3/uL (ref 0.0–0.7)
Eosinophils Relative: 0 % (ref 0–5)
HCT: 38.4 % (ref 36.0–46.0)
Hemoglobin: 12.7 g/dL (ref 12.0–15.0)
Lymphocytes Relative: 18 % (ref 12–46)
Lymphs Abs: 1.5 10*3/uL (ref 0.7–4.0)
MCH: 30.8 pg (ref 26.0–34.0)
MCHC: 33.1 g/dL (ref 30.0–36.0)
MCV: 93 fL (ref 78.0–100.0)
Monocytes Absolute: 0.5 10*3/uL (ref 0.1–1.0)
Monocytes Relative: 6 % (ref 3–12)
Neutro Abs: 6.4 10*3/uL (ref 1.7–7.7)
Neutrophils Relative %: 76 % (ref 43–77)
Platelets: 311 10*3/uL (ref 150–400)
RBC: 4.13 MIL/uL (ref 3.87–5.11)
RDW: 12.7 % (ref 11.5–15.5)
WBC: 8.4 10*3/uL (ref 4.0–10.5)

## 2013-12-13 LAB — BASIC METABOLIC PANEL
BUN: 8 mg/dL (ref 6–23)
CO2: 22 mEq/L (ref 19–32)
Calcium: 9.7 mg/dL (ref 8.4–10.5)
Chloride: 101 mEq/L (ref 96–112)
Creatinine, Ser: 0.77 mg/dL (ref 0.50–1.10)
GFR calc Af Amer: >90 mL/min (ref >90)
GFR calc non Af Amer: >90 mL/min (ref >90)
Glucose, Bld: 104 mg/dL — ABNORMAL HIGH (ref 70–99)
Potassium: 4 mEq/L (ref 3.7–5.3)
Sodium: 139 mEq/L (ref 137–147)

## 2013-12-13 LAB — URINE MICROSCOPIC-ADD ON

## 2013-12-13 LAB — I-STAT TROPONIN, ED: Troponin i, poc: 0 ng/mL (ref 0.00–0.08)

## 2013-12-13 MED ORDER — ONDANSETRON 4 MG PO TBDP: 4.0000 mg | ORAL_TABLET | Freq: Three times a day (TID) | ORAL | Status: DC | PRN

## 2013-12-13 MED ORDER — FLUTICASONE PROPIONATE 50 MCG/ACT NA SUSP: NASAL | Status: AC

## 2013-12-13 MED ORDER — ONDANSETRON HCL 4 MG/2ML IJ SOLN
4.0000 mg | Freq: Once | INTRAMUSCULAR | Status: AC
  Administered 2013-12-13: 4 mg via INTRAVENOUS
  Filled 2013-12-13: qty 2

## 2013-12-13 MED ORDER — SODIUM CHLORIDE 0.9 % IV BOLUS (SEPSIS)
1000.0000 mL | Freq: Once | INTRAVENOUS | Status: AC
  Administered 2013-12-13: 1000 mL via INTRAVENOUS

## 2013-12-13 MED ORDER — SODIUM CHLORIDE 0.9 % IV SOLN
Freq: Once | INTRAVENOUS | Status: AC
  Administered 2013-12-13: 16:00:00 via INTRAVENOUS

## 2013-12-13 NOTE — ED Provider Notes (Addendum)
CSN: [id number]     Arrival date & time 12/13/13  1233 History   First MD Initiated Contact with Patient 12/13/13 1447     Chief Complaint  Patient presents with  . Dizziness      HPI  Patient returns today after being evaluated in the emergency room yesterday with dizziness.  She is 6 weeks status post right total hip arthroplasty. Yesterday was at physical therapy. States she had not eaten or drank much because she felt nauseated. Her evaluation yesterday was unrevealing. Was hydrated and given antiemetics. She was feeling improved. She was discharged. She felt some dizziness and nausea again last night and presents again this morning. She saw her primary care physician this morning, her blood pressure was 96 systolic. She takes Cozaar and Toprol. She did take it yesterday morning, has not taken it today.  States she still is mildly nauseated but has managed to drink some this morning. 2 loose stools this morning. No blood. No frank vomiting. No dysuria and is voiding. She states her right face and cheek feels hot and flushed. She is uncertain if it is painful. Does not have fever at home.  Past Medical History  Diagnosis Date  . PONV (postoperative nausea and vomiting)   . Family history of anesthesia complication     father hard to wake up and N/V  . H/O hiatal hernia   . Shortness of breath     states its from anxiety & allergies  . Hypertension   . Anxiety   . Depression   . Anemia     h/o of....none at the moment  . Arthritis    Past Surgical History  Procedure Laterality Date  . Back surgery      lumbar  . Cholecystectomy    . Mohs surgery    . Total hip arthroplasty Right 10/30/2013    DR Mayer Camel  . Total hip arthroplasty Right 10/30/2013    Procedure: RIGHT TOTAL HIP ARTHROPLASTY;  Surgeon: Kerin Salen, MD;  Location: [facility];  Service: Orthopedics;  Laterality: Right;   No family history on file. History  Substance Use Topics  . Smoking status: Never Smoker   .  Smokeless tobacco: Never Used  . Alcohol Use: No     Comment: occasiona   OB History   Grav Para Term Preterm Abortions TAB SAB Ect Mult Living                 Review of Systems  Constitutional: Positive for fatigue. Negative for fever, chills, diaphoresis and appetite change.  HENT: Negative for mouth sores, sore throat and trouble swallowing.        "Flushing" of her right cheek.  Eyes: Negative for visual disturbance.  Respiratory: Positive for shortness of breath. Negative for cough, chest tightness and wheezing.   Cardiovascular: Negative for chest pain.  Gastrointestinal: Positive for nausea, diarrhea and constipation. Negative for vomiting, abdominal pain, blood in stool, abdominal distention, anal bleeding and rectal pain.  Endocrine: Negative for polydipsia, polyphagia and polyuria.  Genitourinary: Negative for dysuria, urgency, frequency, hematuria and difficulty urinating.  Musculoskeletal: Negative for gait problem.  Skin: Negative for color change, pallor and rash.  Neurological: Positive for dizziness. Negative for syncope, light-headedness and headaches.  Hematological: Does not bruise/bleed easily.  Psychiatric/Behavioral: Negative for behavioral problems and confusion.      Allergies  Claritin-d 12 hour and Codeine  Home Medications   Prior to Admission medications   Medication Sig Start Date End  Date Taking? Authorizing Provider  desonide (DESOWEN) 0.05 % ointment Apply 1 application topically 2 (two) times daily as needed (to lip).    Yes Historical Provider, MD  dexlansoprazole (DEXILANT) 60 MG capsule Take 60 mg by mouth daily.   Yes Historical Provider, MD  docusate sodium (COLACE) 100 MG capsule Take 100 mg by mouth daily as needed for mild constipation.    Yes Historical Provider, MD  FLUoxetine (PROZAC) 40 MG capsule Take 40 mg by mouth daily.   Yes Historical Provider, MD  ibuprofen (ADVIL,MOTRIN) 200 MG tablet Take 600-800 mg by mouth every 6 (six)  hours as needed for headache or mild pain.   Yes Historical Provider, MD  LORazepam (ATIVAN) 0.5 MG tablet Take 0.5 mg by mouth every 8 (eight) hours as needed for anxiety.   Yes Historical Provider, MD  losartan (COZAAR) 50 MG tablet Take 50 mg by mouth daily.   Yes Historical Provider, MD  methocarbamol (ROBAXIN) 500 MG tablet Take 500 mg by mouth every 6 (six) hours as needed for muscle spasms. 1 tablet q 6hr prn muscle spasm 10/31/13  Yes Kerin Salen, MD  metoprolol succinate (TOPROL-XL) 25 MG 24 hr tablet Take 25 mg by mouth daily.   Yes Historical Provider, MD  oxyCODONE-acetaminophen (ROXICET) 5-325 MG per tablet Take 1-2 tablets by mouth every 4 (four) hours as needed. 10/31/13  Yes Kerin Salen, MD  simvastatin (ZOCOR) 20 MG tablet Take 20 mg by mouth daily.   Yes Historical Provider, MD  zolpidem (AMBIEN) 10 MG tablet Take 10 mg by mouth at bedtime as needed for sleep.   Yes Historical Provider, MD  fluticasone Asencion Islam) 50 MCG/ACT nasal spray 1 spray each nares bid 12/13/13   Tanna Furry, MD  ondansetron (ZOFRAN ODT) 4 MG disintegrating tablet Take 1 tablet (4 mg total) by mouth every 8 (eight) hours as needed for nausea. 12/13/13   Tanna Furry, MD   BP 120/62  Pulse 95  Temp(Src) 98.1 F (36.7 C) (Oral)  Resp 18  SpO2 100% Physical Exam  Constitutional: She is oriented to person, place, and time. She appears well-developed and well-nourished. No distress.  He is awake alert. She is slightly anxious.  HENT:  Head: Normocephalic.    Eyes: Conjunctivae are normal. Pupils are equal, round, and reactive to light. No scleral icterus.  Neck: Normal range of motion. Neck supple. No thyromegaly present.  Supple with no adenopathy  Cardiovascular: Regular rhythm.  Tachycardia present.  Exam reveals no gallop and no friction rub.   No murmur heard. Resting tachycardia 107 sitting upright. BP 120/77. Normal heart tones.  Pulmonary/Chest: Effort normal and breath sounds normal. No respiratory  distress. She has no wheezes. She has no rales.  Clear bilateral breath sounds  Abdominal: Soft. Bowel sounds are normal. She exhibits no distension. There is no tenderness. There is no rebound.  Musculoskeletal: Normal range of motion.  Well-healed right total hip incision. No asymmetry to the circumference of the legs. No swelling to lower extremities.  Neurological: She is alert and oriented to person, place, and time.  Skin: Skin is warm and dry. No rash noted.  Psychiatric: She has a normal mood and affect. Her behavior is normal.    ED Course  Procedures (including critical care time) Labs Review Labs Reviewed  BASIC METABOLIC PANEL - Abnormal; Notable for the following:    Glucose, Bld 104 (*)    All other components within normal limits  URINALYSIS, ROUTINE W REFLEX MICROSCOPIC -  Abnormal; Notable for the following:    Hgb urine dipstick TRACE (*)    Ketones, ur 15 (*)    All other components within normal limits  URINE CULTURE  CBC WITH DIFFERENTIAL  D-DIMER, QUANTITATIVE  URINE MICROSCOPIC-ADD ON  I-STAT TROPOININ, ED    Imaging Review No results found.   EKG Interpretation None      MDM   Final diagnoses:  Dizziness  Nausea    Plan will be like a WBC for evaluation for sepsis or occult infection. Urinalysis. EKG shows no changes. Troponin and d-dimer ordered and pending. Fluid hydration and Zofran. Reevaluation.  17:10:  Labs are reassuring. No sign of infection. Negative d-dimer. Unchanged EKG and normal troponin. Feeling better after fluids. A lot of this may be some degree of anxiety and she is reassured and much calmer. Plan will be home. Zofran for nausea. Avoid her Sudafed in her allergy medication. We'll give a Flonase inhaler. 2. ENT followup. Hold your blood pressure medications.  Tanna Furry, MD 12/13/13 [place], MD 12/13/13 [phone]

## 2013-12-13 NOTE — ED Provider Notes (Signed)
Medical screening examination/treatment/procedure(s) were conducted as a shared visit with non-physician practitioner(s) and myself.  I personally evaluated the patient during the encounter.   EKG Interpretation None       Pt with multiple complaints, however felt improved on my evaluation and she felt comfortable with d/c home  Sharyon Cable, MD 12/13/13 2027

## 2013-12-13 NOTE — ED Notes (Signed)
Pt states she feels dizzy, tired and very anxious. States she was seen here yesterday for same. Denies pain at the time. No neurological deficits noted. Pt is alert and oriented x4. NAD.

## 2013-12-13 NOTE — ED Notes (Signed)
1st liter nss infused  2nd liter hung to run 141ml/hr.  No pain

## 2013-12-13 NOTE — Discharge Instructions (Signed)
Her blood pressure medicines until you recheck with your physician. Stay hydrated with a drink of your choice containing sugar and electrolytes. Use Zofran for nausea. Avoid decongestants. Use Flonase for your nasal congestion.  Dizziness Dizziness is a common problem. It is a feeling of unsteadiness or light-headedness. You may feel like you are about to faint. Dizziness can lead to injury if you stumble or fall. A person of any age group can suffer from dizziness, but dizziness is more common in older adults. CAUSES  Dizziness can be caused by many different things, including:  Middle ear problems.  Standing for too long.  Infections.  An allergic reaction.  Aging.  An emotional response to something, such as the sight of blood.  Side effects of medicines.  Tiredness.  Problems with circulation or blood pressure.  Excessive use of alcohol or medicines, or illegal drug use.  Breathing too fast (hyperventilation).  An irregular heart rhythm (arrhythmia).  A low red blood cell count (anemia).  Pregnancy.  Vomiting, diarrhea, fever, or other illnesses that cause body fluid loss (dehydration).  Diseases or conditions such as Parkinson's disease, high blood pressure (hypertension), diabetes, and thyroid problems.  Exposure to extreme heat. DIAGNOSIS  Your health care provider will ask about your symptoms, perform a physical exam, and perform an electrocardiogram (ECG) to record the electrical activity of your heart. Your health care provider may also perform other heart or blood tests to determine the cause of your dizziness. These may include:  Transthoracic echocardiogram (TTE). During echocardiography, sound waves are used to evaluate how blood flows through your heart.  Transesophageal echocardiogram (TEE).  Cardiac monitoring. This allows your health care provider to monitor your heart rate and rhythm in real time.  Holter monitor. This is a portable device that  records your heartbeat and can help diagnose heart arrhythmias. It allows your health care provider to track your heart activity for several days if needed.  Stress tests by exercise or by giving medicine that makes the heart beat faster. TREATMENT  Treatment of dizziness depends on the cause of your symptoms and can vary greatly. HOME CARE INSTRUCTIONS   Drink enough fluids to keep your urine clear or pale yellow. This is especially important in very hot weather. In older adults, it is also important in cold weather.  Take your medicine exactly as directed if your dizziness is caused by medicines. When taking blood pressure medicines, it is especially important to get up slowly.  Rise slowly from chairs and steady yourself until you feel okay.  In the morning, first sit up on the side of the bed. When you feel okay, stand slowly while holding onto something until you know your balance is fine.  Move your legs often if you need to stand in one place for a long time. Tighten and relax your muscles in your legs while standing.  Have someone stay with you for 1-2 days if dizziness continues to be a problem. Do this until you feel you are well enough to stay alone. Have the person call your health care provider if he or she notices changes in you that are concerning.  Do not drive or use heavy machinery if you feel dizzy.  Do not drink alcohol. SEEK IMMEDIATE MEDICAL CARE IF:   Your dizziness or light-headedness gets worse.  You feel nauseous or vomit.  You have problems talking, walking, or using your arms, hands, or legs.  You feel weak.  You are not thinking clearly or  you have trouble forming sentences. It may take a friend or family member to notice this.  You have chest pain, abdominal pain, shortness of breath, or sweating.  Your vision changes.  You notice any bleeding.  You have side effects from medicine that seems to be getting worse rather than better. MAKE SURE YOU:     Understand these instructions.  Will watch your condition.  Will get help right away if you are not doing well or get worse. Document Released: 12/09/2000 Document Revised: 06/20/2013 Document Reviewed: 01/02/2011 [facility] Patient Information 2015 [place], [place]. This information is not intended to replace advice given to you by your health care provider. Make sure you discuss any questions you have with your health care provider.  Nausea and Vomiting Nausea is a sick feeling that often comes before throwing up (vomiting). Vomiting is a reflex where stomach contents come out of your mouth. Vomiting can cause severe loss of body fluids (dehydration). Children and elderly adults can become dehydrated quickly, especially if they also have diarrhea. Nausea and vomiting are symptoms of a condition or disease. It is important to find the cause of your symptoms. CAUSES   Direct irritation of the stomach lining. This irritation can result from increased acid production (gastroesophageal reflux disease), infection, food poisoning, taking certain medicines (such as nonsteroidal anti-inflammatory drugs), alcohol use, or tobacco use.  Signals from the brain.These signals could be caused by a headache, heat exposure, an inner ear disturbance, increased pressure in the brain from injury, infection, a tumor, or a concussion, pain, emotional stimulus, or metabolic problems.  An obstruction in the gastrointestinal tract (bowel obstruction).  Illnesses such as diabetes, hepatitis, gallbladder problems, appendicitis, kidney problems, cancer, sepsis, atypical symptoms of a heart attack, or eating disorders.  Medical treatments such as chemotherapy and radiation.  Receiving medicine that makes you sleep (general anesthetic) during surgery. DIAGNOSIS Your caregiver may ask for tests to be done if the problems do not improve after a few days. Tests may also be done if symptoms are severe or if the reason for  the nausea and vomiting is not clear. Tests may include:  Urine tests.  Blood tests.  Stool tests.  Cultures (to look for evidence of infection).  X-rays or other imaging studies. Test results can help your caregiver make decisions about treatment or the need for additional tests. TREATMENT You need to stay well hydrated. Drink frequently but in small amounts.You may wish to drink water, sports drinks, clear broth, or eat frozen ice pops or gelatin dessert to help stay hydrated.When you eat, eating slowly may help prevent nausea.There are also some antinausea medicines that may help prevent nausea. HOME CARE INSTRUCTIONS   Take all medicine as directed by your caregiver.  If you do not have an appetite, do not force yourself to eat. However, you must continue to drink fluids.  If you have an appetite, eat a normal diet unless your caregiver tells you differently.  Eat a variety of complex carbohydrates (rice, wheat, potatoes, bread), lean meats, yogurt, fruits, and vegetables.  Avoid high-fat foods because they are more difficult to digest.  Drink enough water and fluids to keep your urine clear or pale yellow.  If you are dehydrated, ask your caregiver for specific rehydration instructions. Signs of dehydration may include:  Severe thirst.  Dry lips and mouth.  Dizziness.  Dark urine.  Decreasing urine frequency and amount.  Confusion.  Rapid breathing or pulse. SEEK IMMEDIATE MEDICAL CARE IF:   You  have blood or brown flecks (like coffee grounds) in your vomit.  You have black or bloody stools.  You have a severe headache or stiff neck.  You are confused.  You have severe abdominal pain.  You have chest pain or trouble breathing.  You do not urinate at least once every 8 hours.  You develop cold or clammy skin.  You continue to vomit for longer than 24 to 48 hours.  You have a fever. MAKE SURE YOU:   Understand these instructions.  Will watch  your condition.  Will get help right away if you are not doing well or get worse. Document Released: 06/15/2005 Document Revised: 09/07/2011 Document Reviewed: 11/12/2010 [facility] Patient Information 2015 [place], [place]. This information is not intended to replace advice given to you by your health care provider. Make sure you discuss any questions you have with your health care provider.

## 2013-12-13 NOTE — ED Notes (Addendum)
Pt was seen at MDs office this morning and BP was 96 systolic; was sent here today due to MD thinking she is still dehydrated or septic. Pt is A&Ox3 and states she feels lightheaded. States she has been feeling anxious. Reports mild SOB. Had hip replacement 6 weeks ago. Pt had work up yesterday and was sent home. Pt continuing to state she is very anxious.

## 2013-12-13 NOTE — ED Notes (Signed)
No pain just received orders

## 2013-12-13 NOTE — ED Notes (Signed)
Pt up to the br no complaints of pain except in her lower back from lying on this stretcher

## 2013-12-14 LAB — URINE CULTURE
COLONY COUNT: NO GROWTH
Culture: NO GROWTH

## 2013-12-22 ENCOUNTER — Other Ambulatory Visit: Payer: Self-pay | Admitting: Family Medicine

## 2013-12-22 ENCOUNTER — Ambulatory Visit
Admission: RE | Admit: 2013-12-22 | Discharge: 2013-12-22 | Disposition: A | Discharge status: Home / Self Care | Payer: 59 | Source: Ambulatory Visit | Attending: Family Medicine | Admitting: Family Medicine

## 2013-12-22 DIAGNOSIS — R000 Tachycardia, unspecified: Secondary | ICD-10-CM

## 2013-12-22 MED ORDER — IOHEXOL 350 MG/ML SOLN
125.0000 mL | Freq: Once | INTRAVENOUS | Status: AC | PRN
  Administered 2013-12-22: 125 mL via INTRAVENOUS

## 2013-12-22 NOTE — OR Nursing (Signed)
Addendum to scope page 

## 2014-02-05 ENCOUNTER — Other Ambulatory Visit: Payer: Self-pay | Admitting: Dermatology

## 2014-04-19 ENCOUNTER — Other Ambulatory Visit: Payer: Self-pay | Admitting: Dermatology

## 2016-07-02 DIAGNOSIS — J329 Chronic sinusitis, unspecified: Secondary | ICD-10-CM | POA: Diagnosis not present

## 2016-07-31 DIAGNOSIS — G479 Sleep disorder, unspecified: Secondary | ICD-10-CM | POA: Diagnosis not present

## 2016-07-31 DIAGNOSIS — J329 Chronic sinusitis, unspecified: Secondary | ICD-10-CM | POA: Diagnosis not present

## 2016-09-15 DIAGNOSIS — E7800 Pure hypercholesterolemia, unspecified: Secondary | ICD-10-CM | POA: Diagnosis not present

## 2016-09-15 DIAGNOSIS — R05 Cough: Secondary | ICD-10-CM | POA: Diagnosis not present

## 2016-09-15 DIAGNOSIS — K219 Gastro-esophageal reflux disease without esophagitis: Secondary | ICD-10-CM | POA: Diagnosis not present

## 2016-09-15 DIAGNOSIS — G479 Sleep disorder, unspecified: Secondary | ICD-10-CM | POA: Diagnosis not present

## 2016-11-04 DIAGNOSIS — K449 Diaphragmatic hernia without obstruction or gangrene: Secondary | ICD-10-CM | POA: Diagnosis not present

## 2016-11-04 DIAGNOSIS — R05 Cough: Secondary | ICD-10-CM | POA: Diagnosis not present

## 2016-11-04 DIAGNOSIS — J309 Allergic rhinitis, unspecified: Secondary | ICD-10-CM | POA: Diagnosis not present

## 2016-12-08 DIAGNOSIS — K440 Diaphragmatic hernia with obstruction, without gangrene: Secondary | ICD-10-CM | POA: Diagnosis not present

## 2016-12-08 DIAGNOSIS — K219 Gastro-esophageal reflux disease without esophagitis: Secondary | ICD-10-CM | POA: Diagnosis not present

## 2016-12-08 DIAGNOSIS — R933 Abnormal findings on diagnostic imaging of other parts of digestive tract: Secondary | ICD-10-CM | POA: Diagnosis not present

## 2016-12-17 DIAGNOSIS — K449 Diaphragmatic hernia without obstruction or gangrene: Secondary | ICD-10-CM | POA: Diagnosis not present

## 2017-01-01 NOTE — Progress Notes (Signed)
Please place orders in EPIC as patient is being scheduled for a pre-op appointment! Thank you! 

## 2017-01-07 NOTE — Patient Instructions (Addendum)
Janice Orr  01/07/2017   Your procedure is scheduled on: 01-15-17  Report to [facility] Main  Entrance Take [place]  elevators to 3rd floor to  [place] at [phone].    Call this number if you have problems the morning of surgery [phone]    Remember: ONLY 1 PERSON MAY GO WITH YOU TO SHORT STAY TO GET  READY MORNING OF [place].  Do not eat food After Midnight. You may have clear liquids from midnight until 715am day of surgery. Nothing by mouth after 715am!!     Take these medicines the morning of surgery with A SIP OF WATER: dexilant, fluoxetine(prozac), lorazepam(ativan), metoprolol, simvistatin(zocor), oxycodone as needed, venlafaxine(effexor)                                 You may not have any metal on your body including hair pins and              piercings  Do not wear jewelry, make-up, lotions, powders or perfumes, deodorant             Do not wear nail polish.  Do not shave  48 hours prior to surgery.         Do not bring valuables to the hospital. [place].  Contacts, dentures or bridgework may not be worn into surgery.  Leave suitcase in the car. After surgery it may be brought to your room.                Please read over the following fact sheets you were given: ____________________________________________________________________         CLEAR LIQUID DIET   Foods Allowed                                                                     Foods Excluded  Coffee and tea, regular and decaf                             liquids that you cannot  Plain Jell-O in any flavor                                             see through such as: Fruit ices (not with fruit pulp)                                     milk, soups, orange juice  Iced Popsicles                                    All solid food Carbonated beverages, regular and diet  Cranberry,  grape and apple juices Sports drinks like Gatorade Lightly seasoned clear broth or consume(fat free) Sugar, honey syrup  Sample Menu Breakfast                                Lunch                                     Supper Cranberry juice                    Beef broth                            Chicken broth Jell-O                                     Grape juice                           Apple juice Coffee or tea                        Jell-O                                      Popsicle                                                Coffee or tea                        Coffee or tea  _____________________________________________________________________  [facility] - Preparing for Surgery Before surgery, you can play an important role.  Because skin is not sterile, your skin needs to be as free of germs as possible.  You can reduce the number of germs on your skin by washing with CHG (chlorahexidine gluconate) soap before surgery.  CHG is an antiseptic cleaner which kills germs and bonds with the skin to continue killing germs even after washing. Please DO NOT use if you have an allergy to CHG or antibacterial soaps.  If your skin becomes reddened/irritated stop using the CHG and inform your nurse when you arrive at Short Stay. Do not shave (including legs and underarms) for at least 48 hours prior to the first CHG shower.  You may shave your face/neck. Please follow these instructions carefully:  1.  Shower with CHG Soap the night before surgery and the  morning of Surgery.  2.  If you choose to wash your hair, wash your hair first as usual with your  normal  shampoo.  3.  After you shampoo, rinse your hair and body thoroughly to remove the  shampoo.                           4.  Use CHG as you would any other liquid soap.  You can apply chg directly  to the skin and wash  Gently with a scrungie or clean washcloth.  5.  Apply the CHG Soap to your body ONLY FROM THE NECK  DOWN.   Do not use on face/ open                           Wound or open sores. Avoid contact with eyes, ears mouth and genitals (private parts).                       Wash face,  Genitals (private parts) with your normal soap.             6.  Wash thoroughly, paying special attention to the area where your surgery  will be performed.  7.  Thoroughly rinse your body with warm water from the neck down.  8.  DO NOT shower/wash with your normal soap after using and rinsing off  the CHG Soap.                9.  Pat yourself dry with a clean towel.            10.  Wear clean pajamas.            11.  Place clean sheets on your bed the night of your first shower and do not  sleep with pets. Day of Surgery : Do not apply any lotions/deodorants the morning of surgery.  Please wear clean clothes to the hospital/surgery center.  FAILURE TO FOLLOW THESE INSTRUCTIONS MAY RESULT IN THE CANCELLATION OF YOUR SURGERY PATIENT SIGNATURE_________________________________  NURSE SIGNATURE__________________________________  ________________________________________________________________________

## 2017-01-08 ENCOUNTER — Encounter (HOSPITAL_COMMUNITY): Payer: Self-pay

## 2017-01-08 ENCOUNTER — Encounter (HOSPITAL_COMMUNITY)
Admission: RE | Admit: 2017-01-08 | Discharge: 2017-01-08 | Disposition: A | Discharge status: Home / Self Care | Payer: 59 | Source: Ambulatory Visit | Attending: Surgery | Admitting: Surgery

## 2017-01-08 DIAGNOSIS — Z01812 Encounter for preprocedural laboratory examination: Secondary | ICD-10-CM | POA: Diagnosis not present

## 2017-01-08 DIAGNOSIS — K449 Diaphragmatic hernia without obstruction or gangrene: Secondary | ICD-10-CM | POA: Insufficient documentation

## 2017-01-08 DIAGNOSIS — I10 Essential (primary) hypertension: Secondary | ICD-10-CM | POA: Diagnosis not present

## 2017-01-08 HISTORY — DX: Cardiac arrhythmia, unspecified: I49.9

## 2017-01-08 LAB — BASIC METABOLIC PANEL
ANION GAP: 9 (ref 5–15)
BUN: 16 mg/dL (ref 6–20)
CALCIUM: 9 mg/dL (ref 8.9–10.3)
CHLORIDE: 105 mmol/L (ref 101–111)
CO2: 28 mmol/L (ref 22–32)
Creatinine, Ser: 0.91 mg/dL (ref 0.44–1.00)
GFR calc Af Amer: >60 mL/min (ref >60)
GFR calc non Af Amer: >60 mL/min (ref >60)
Glucose, Bld: 92 mg/dL (ref 65–99)
POTASSIUM: 4.3 mmol/L (ref 3.5–5.1)
Sodium: 142 mmol/L (ref 135–145)

## 2017-01-08 LAB — CBC
HEMATOCRIT: 37.8 % (ref 36.0–46.0)
HEMOGLOBIN: 12.2 g/dL (ref 12.0–15.0)
MCH: 29.3 pg (ref 26.0–34.0)
MCHC: 32.3 g/dL (ref 30.0–36.0)
MCV: 90.6 fL (ref 78.0–100.0)
Platelets: 291 10*3/uL (ref 150–400)
RBC: 4.17 MIL/uL (ref 3.87–5.11)
RDW: 13.4 % (ref 11.5–15.5)
WBC: 5 10*3/uL (ref 4.0–10.5)

## 2017-01-08 NOTE — Progress Notes (Signed)
EKG 01-22-16 on chart

## 2017-01-13 NOTE — H&P (Signed)
Janice Orr 12/17/2016 11:41 AM Location: [facility] Patient #: [id number] DOB: [birth year] Married / Language: English / Race: White Female   History of Present Illness Rodman Key B. Hassell Done MD; 12/17/2016 12:23 PM) The patient is a 61 year old female who presents with a hiatal hernia. Janice Orr was referred by Dr. Collene Mares and Dr. Sabra Heck with a large hiatal hernia. She had a recent CT angiogram of her chest and this showed all of her stomach is in her chest. She has a large type IV probably hiatal hernia includes only her stomach but particularly other organs appears well. I don't see a need to repeat an upper GI today. I discussed laparoscopic and open repair and reduction of her hiatal hernia. She now is having problems swallowing and with choking and with regurgitating saliva and phlegm. I think she would benefit from laparoscopic reduction of her hiatal hernia and stomach with repair of the diaphragm probable Nissen fundoplication and possibly gastropexy. She would like to go ahead and move toward getting this scheduled as soon as possible.   She has a large type III-IV hiatal hernia. She will need hiatal hernia repair and possible gastropexy   Past Surgical History Benjiman Core, [place]; 12/17/2016 11:41 AM) Colon Removal - Complete  Gallbladder Surgery - Laparoscopic  Hip Surgery  Right. Spinal Surgery - Lower Back   Diagnostic Studies History Benjiman Core, CMA; 12/17/2016 11:41 AM) Colonoscopy  1-5 years ago Mammogram  1-3 years ago Pap Smear  1-5 years ago  Allergies Benjiman Core, CMA; 12/17/2016 11:46 AM) CODEINE  Claritin-D 12 Hour *COUGH/COLD/ALLERGY*  ERYTHROMYCIN   Medication History Benjiman Core, CMA; 12/17/2016 11:46 AM) Simvastatin (20MG  Tablet, Oral) Active. Metoprolol Succinate ER (25MG  Tablet ER 24HR, Oral) Active. Dexilant (60MG  Capsule DR, Oral) Active. Vitamin D (50000U Capsule, Oral) Active. Venlafaxine HCl ER (150MG  Capsule ER 24HR,  Oral) Active. Loratadine (10MG  Tablet, Oral) Active. Zolpidem Tartrate (10MG  Tablet, Oral) Active. Promethazine-DM (6.25-15MG /5ML Syrup, Oral) Active. Fluticasone Propionate (50MCG/ACT Suspension, Nasal) Active. LORazepam (0.5MG  Tablet, Oral) Active. Medications Reconciled  Social History Benjiman Core, CMA; 12/17/2016 11:41 AM) Alcohol use  Occasional alcohol use. Caffeine use  Coffee, Tea. No drug use  Tobacco use  Never smoker.  Family History Benjiman Core, [place]; 12/17/2016 11:41 AM) Alcohol Abuse  Son. Breast Cancer  Mother. Depression  Sister. Heart Disease  Father. Heart disease in female family member before age 67  Hypertension  Father, Mother, Sister. Melanoma  Father, Mother. Respiratory Condition  Father.  Pregnancy / Birth History Benjiman Core, [place]; 12/17/2016 11:41 AM) Age at menarche  45 years. Age of menopause  59-55 Gravida  3 Length (months) of breastfeeding  12-24 Maternal age  58-30 Para  2  Other Problems Benjiman Core, [place]; 12/17/2016 11:41 AM) Anxiety Disorder  Gastroesophageal Reflux Disease  General anesthesia - complications  High blood pressure  Hypercholesterolemia  Ventral Hernia Repair     Review of Systems (Armen Glenn CMA; 12/17/2016 11:41 AM) General Present- Fatigue and Weight Gain. Not Present- Appetite Loss, Chills, Fever, Night Sweats and Weight Loss. Skin Present- Dryness. Not Present- Change in Wart/Mole, Hives, Jaundice, New Lesions, Non-Healing Wounds, Rash and Ulcer. HEENT Present- Seasonal Allergies. Not Present- Earache, Hearing Loss, Hoarseness, Nose Bleed, Oral Ulcers, Ringing in the Ears, Sinus Pain, Sore Throat, Visual Disturbances, Wears glasses/contact lenses and Yellow Eyes. Respiratory Present- Chronic Cough, Difficulty Breathing and Wheezing. Not Present- Bloody sputum and Snoring. Breast Not Present- Breast Mass, Breast Pain, Nipple Discharge and Skin Changes. Cardiovascular Present- Rapid  Heart  Rate and Shortness of Breath. Not Present- Chest Pain, Difficulty Breathing Lying Down, Leg Cramps, Palpitations and Swelling of Extremities. Gastrointestinal Present- Bloating, Change in Bowel Habits, Difficulty Swallowing, Gets full quickly at meals, Nausea and Vomiting. Not Present- Abdominal Pain, Bloody Stool, Chronic diarrhea, Constipation, Excessive gas, Hemorrhoids, Indigestion and Rectal Pain. Female Genitourinary Present- Frequency. Not Present- Nocturia, Painful Urination, Pelvic Pain and Urgency. Musculoskeletal Not Present- Back Pain, Joint Pain, Joint Stiffness, Muscle Pain, Muscle Weakness and Swelling of Extremities. Neurological Not Present- Decreased Memory, Fainting, Headaches, Numbness, Seizures, Tingling, Tremor, Trouble walking and Weakness. Psychiatric Present- Anxiety. Not Present- Bipolar, Change in Sleep Pattern, Depression, Fearful and Frequent crying. Endocrine Not Present- Cold Intolerance, Excessive Hunger, Hair Changes, Heat Intolerance, Hot flashes and New Diabetes. Hematology Present- Easy Bruising. Not Present- Blood Thinners, Excessive bleeding, Gland problems, HIV and Persistent Infections.  Vitals (Armen Glenn CMA; 12/17/2016 11:42 AM) 12/17/2016 11:41 AM Weight: 201.38 lb Temp.: 98.106F  Pulse: 104 (Regular)  BP: 140/90 (Sitting, Left Arm, Standard)       Physical Exam (Demontrae Gilbert B. Hassell Done MD; 12/17/2016 12:24 PM) General Note: WDWNWF NAD HEENT unremarkable Neck supple Chest clear Heart SR Abdomen nontender Ext FROM Neuro alert and oriented x3     Assessment & Plan Rodman Key B. Hassell Done MD; 12/17/2016 12:25 PM) HIATAL HERNIA (K44.9) Impression: most if not all of stomach in the chest. Plan lap repair of hiatal hernia with nissen and possible gastropexy. Will schedule at [facility] for 2.5 hrs.  Matt B. Hassell Done, MD, FACS

## 2017-01-14 NOTE — Progress Notes (Signed)
Need new oreop orders placed in epic.  Surgery on 01/28/2017.  Original preop on 01/08/17.

## 2017-01-18 NOTE — Progress Notes (Signed)
LM on VM for patient for new time change and date of surgery. Surgery date is 01/28/2017 and to report to the third floor Short Stay at 0530 am with nothing to drink or eat after midnight! LM on VM if any questions to call back at [phone]!

## 2017-01-20 NOTE — Progress Notes (Signed)
Spoke with patient  And instructed patient to arrive on 01/28/2017 at [place] on third floor at 0530 am  And nothing to eat or drink after midnight. Patient verbalized understanding.

## 2017-01-20 NOTE — Progress Notes (Signed)
LM on VM for patient to call back to confirm she received message on voicemail for time change and date change for surgery.

## 2017-01-27 ENCOUNTER — Ambulatory Visit: Payer: Self-pay | Admitting: Surgery

## 2017-01-28 ENCOUNTER — Encounter (HOSPITAL_COMMUNITY)
Admission: RE | Disposition: A | Discharge status: Home / Self Care | Payer: Self-pay | Source: Ambulatory Visit | Attending: Surgery

## 2017-01-28 ENCOUNTER — Inpatient Hospital Stay (HOSPITAL_COMMUNITY): Payer: 59 | Admitting: Registered Nurse

## 2017-01-28 ENCOUNTER — Inpatient Hospital Stay (HOSPITAL_COMMUNITY)
Admission: RE | Admit: 2017-01-28 | Discharge: 2017-01-31 | DRG: 328 | Disposition: A | Discharge status: Home / Self Care | Payer: 59 | Source: Ambulatory Visit | Attending: Surgery | Admitting: Surgery

## 2017-01-28 ENCOUNTER — Encounter (HOSPITAL_COMMUNITY): Payer: Self-pay | Admitting: *Deleted

## 2017-01-28 DIAGNOSIS — K219 Gastro-esophageal reflux disease without esophagitis: Secondary | ICD-10-CM | POA: Diagnosis present

## 2017-01-28 DIAGNOSIS — K449 Diaphragmatic hernia without obstruction or gangrene: Principal | ICD-10-CM | POA: Diagnosis present

## 2017-01-28 DIAGNOSIS — M1611 Unilateral primary osteoarthritis, right hip: Secondary | ICD-10-CM | POA: Diagnosis not present

## 2017-01-28 DIAGNOSIS — E669 Obesity, unspecified: Secondary | ICD-10-CM | POA: Diagnosis present

## 2017-01-28 DIAGNOSIS — I10 Essential (primary) hypertension: Secondary | ICD-10-CM | POA: Diagnosis present

## 2017-01-28 DIAGNOSIS — F329 Major depressive disorder, single episode, unspecified: Secondary | ICD-10-CM | POA: Diagnosis present

## 2017-01-28 DIAGNOSIS — Z8719 Personal history of other diseases of the digestive system: Secondary | ICD-10-CM

## 2017-01-28 DIAGNOSIS — E7800 Pure hypercholesterolemia, unspecified: Secondary | ICD-10-CM | POA: Diagnosis present

## 2017-01-28 DIAGNOSIS — F419 Anxiety disorder, unspecified: Secondary | ICD-10-CM | POA: Diagnosis present

## 2017-01-28 DIAGNOSIS — Z6831 Body mass index (BMI) 31.0-31.9, adult: Secondary | ICD-10-CM | POA: Diagnosis not present

## 2017-01-28 DIAGNOSIS — Z79899 Other long term (current) drug therapy: Secondary | ICD-10-CM

## 2017-01-28 DIAGNOSIS — Z9889 Other specified postprocedural states: Secondary | ICD-10-CM

## 2017-01-28 HISTORY — PX: HIATAL HERNIA REPAIR: SHX195

## 2017-01-28 LAB — CBC
HCT: 36.8 % (ref 36.0–46.0)
Hemoglobin: 12.1 g/dL (ref 12.0–15.0)
MCH: 29.7 pg (ref 26.0–34.0)
MCHC: 32.9 g/dL (ref 30.0–36.0)
MCV: 90.4 fL (ref 78.0–100.0)
PLATELETS: 236 10*3/uL (ref 150–400)
RBC: 4.07 MIL/uL (ref 3.87–5.11)
RDW: 12.9 % (ref 11.5–15.5)
WBC: 10.7 10*3/uL — AB (ref 4.0–10.5)

## 2017-01-28 LAB — CREATININE, SERUM
Creatinine, Ser: 0.92 mg/dL (ref 0.44–1.00)
GFR calc Af Amer: >60 mL/min (ref >60)
GFR calc non Af Amer: >60 mL/min (ref >60)

## 2017-01-28 SURGERY — REPAIR, HERNIA, HIATAL, LAPAROSCOPIC
Anesthesia: General

## 2017-01-28 MED ORDER — ROCURONIUM BROMIDE 50 MG/5ML IV SOSY
PREFILLED_SYRINGE | INTRAVENOUS | Status: AC
  Filled 2017-01-28: qty 5

## 2017-01-28 MED ORDER — ONDANSETRON HCL 4 MG/2ML IJ SOLN
4.0000 mg | Freq: Four times a day (QID) | INTRAMUSCULAR | Status: DC | PRN
  Administered 2017-01-29: 4 mg via INTRAVENOUS
  Filled 2017-01-28: qty 2

## 2017-01-28 MED ORDER — LIDOCAINE 2% (20 MG/ML) 5 ML SYRINGE
INTRAMUSCULAR | Status: DC | PRN
  Administered 2017-01-28: 100 mg via INTRAVENOUS

## 2017-01-28 MED ORDER — ONDANSETRON HCL 4 MG/2ML IJ SOLN
INTRAMUSCULAR | Status: AC
  Filled 2017-01-28: qty 2

## 2017-01-28 MED ORDER — CHLORHEXIDINE GLUCONATE CLOTH 2 % EX PADS: 6.0000 | MEDICATED_PAD | Freq: Once | CUTANEOUS | Status: DC

## 2017-01-28 MED ORDER — SUGAMMADEX SODIUM 200 MG/2ML IV SOLN
INTRAVENOUS | Status: DC | PRN
  Administered 2017-01-28: 200 mg via INTRAVENOUS

## 2017-01-28 MED ORDER — EPHEDRINE SULFATE-NACL 50-0.9 MG/10ML-% IV SOSY
PREFILLED_SYRINGE | INTRAVENOUS | Status: DC | PRN
  Administered 2017-01-28: 10 mg via INTRAVENOUS
  Administered 2017-01-28: 15 mg via INTRAVENOUS

## 2017-01-28 MED ORDER — PROCHLORPERAZINE MALEATE 10 MG PO TABS: 10.0000 mg | ORAL_TABLET | Freq: Four times a day (QID) | ORAL | Status: DC | PRN

## 2017-01-28 MED ORDER — ONDANSETRON HCL 4 MG/2ML IJ SOLN
INTRAMUSCULAR | Status: DC | PRN
  Administered 2017-01-28: 4 mg via INTRAVENOUS

## 2017-01-28 MED ORDER — RINGERS IRRIGATION IR SOLN: Status: DC | PRN

## 2017-01-28 MED ORDER — PROPOFOL 500 MG/50ML IV EMUL
INTRAVENOUS | Status: DC | PRN
  Administered 2017-01-28: 100 ug/kg/min via INTRAVENOUS

## 2017-01-28 MED ORDER — METOPROLOL TARTRATE 5 MG/5ML IV SOLN: 5.0000 mg | Freq: Four times a day (QID) | INTRAVENOUS | Status: DC | PRN

## 2017-01-28 MED ORDER — CEFAZOLIN SODIUM-DEXTROSE 2-4 GM/100ML-% IV SOLN
2.0000 g | Freq: Three times a day (TID) | INTRAVENOUS | Status: AC
  Administered 2017-01-28: 2 g via INTRAVENOUS
  Filled 2017-01-28: qty 100

## 2017-01-28 MED ORDER — EPHEDRINE 5 MG/ML INJ
INTRAVENOUS | Status: AC
Start: 2017-01-28 — End: 2017-01-28
  Filled 2017-01-28: qty 10

## 2017-01-28 MED ORDER — PHENYLEPHRINE HCL 10 MG/ML IJ SOLN
INTRAVENOUS | Status: DC | PRN
  Administered 2017-01-28: 30 ug/min via INTRAVENOUS

## 2017-01-28 MED ORDER — PROPOFOL 10 MG/ML IV BOLUS
INTRAVENOUS | Status: AC
  Filled 2017-01-28: qty 20

## 2017-01-28 MED ORDER — PROPOFOL 10 MG/ML IV BOLUS
INTRAVENOUS | Status: DC | PRN
Start: 2017-01-28 — End: 2017-01-28
  Administered 2017-01-28: 100 mg via INTRAVENOUS

## 2017-01-28 MED ORDER — HEPARIN SODIUM (PORCINE) 5000 UNIT/ML IJ SOLN
5000.0000 [IU] | Freq: Three times a day (TID) | INTRAMUSCULAR | Status: DC
  Administered 2017-01-29 – 2017-01-31 (×8): 5000 [IU] via SUBCUTANEOUS
  Filled 2017-01-28 (×8): qty 1

## 2017-01-28 MED ORDER — HYDRALAZINE HCL 20 MG/ML IJ SOLN
10.0000 mg | INTRAMUSCULAR | Status: DC | PRN
  Filled 2017-01-28: qty 0.5

## 2017-01-28 MED ORDER — SUGAMMADEX SODIUM 200 MG/2ML IV SOLN
INTRAVENOUS | Status: AC
  Filled 2017-01-28: qty 2

## 2017-01-28 MED ORDER — FENTANYL CITRATE (PF) 100 MCG/2ML IJ SOLN
12.5000 ug | INTRAMUSCULAR | Status: DC | PRN
  Administered 2017-01-28 – 2017-01-31 (×18): 12.5 ug via INTRAVENOUS
  Filled 2017-01-28 (×17): qty 2

## 2017-01-28 MED ORDER — DEXTROSE 5 % IV SOLN
3.0000 g | INTRAVENOUS | Status: AC
  Administered 2017-01-28: 3 g via INTRAVENOUS
  Filled 2017-01-28: qty 3

## 2017-01-28 MED ORDER — PROPOFOL 10 MG/ML IV BOLUS
INTRAVENOUS | Status: AC
  Filled 2017-01-28: qty 40

## 2017-01-28 MED ORDER — LIDOCAINE 2% (20 MG/ML) 5 ML SYRINGE
INTRAMUSCULAR | Status: AC
  Filled 2017-01-28: qty 5

## 2017-01-28 MED ORDER — BUPIVACAINE-EPINEPHRINE 0.25% -1:200000 IJ SOLN
INTRAMUSCULAR | Status: DC | PRN
  Administered 2017-01-28: 20 mL

## 2017-01-28 MED ORDER — PHENYLEPHRINE 40 MCG/ML (10ML) SYRINGE FOR IV PUSH (FOR BLOOD PRESSURE SUPPORT)
PREFILLED_SYRINGE | INTRAVENOUS | Status: DC | PRN
  Administered 2017-01-28 (×2): 80 ug via INTRAVENOUS

## 2017-01-28 MED ORDER — LACTATED RINGERS IV SOLN
INTRAVENOUS | Status: DC | PRN
  Administered 2017-01-28 (×2): via INTRAVENOUS

## 2017-01-28 MED ORDER — FENTANYL CITRATE (PF) 250 MCG/5ML IJ SOLN
INTRAMUSCULAR | Status: AC
  Filled 2017-01-28: qty 5

## 2017-01-28 MED ORDER — PROPOFOL 10 MG/ML IV BOLUS
INTRAVENOUS | Status: AC
Start: 2017-01-28 — End: 2017-01-28
  Filled 2017-01-28: qty 40

## 2017-01-28 MED ORDER — LIDOCAINE 2% (20 MG/ML) 5 ML SYRINGE
INTRAMUSCULAR | Status: AC
Start: 2017-01-28 — End: 2017-01-28
  Filled 2017-01-28: qty 5

## 2017-01-28 MED ORDER — SODIUM CHLORIDE 0.9 % IJ SOLN
INTRAMUSCULAR | Status: AC
  Filled 2017-01-28: qty 50

## 2017-01-28 MED ORDER — BUPIVACAINE LIPOSOME 1.3 % IJ SUSP
20.0000 mL | Freq: Once | INTRAMUSCULAR | Status: DC
  Filled 2017-01-28: qty 20

## 2017-01-28 MED ORDER — PROMETHAZINE HCL 25 MG/ML IJ SOLN: 6.2500 mg | INTRAMUSCULAR | Status: DC | PRN

## 2017-01-28 MED ORDER — ONDANSETRON 4 MG PO TBDP: 4.0000 mg | ORAL_TABLET | Freq: Four times a day (QID) | ORAL | Status: DC | PRN

## 2017-01-28 MED ORDER — LACTATED RINGERS IV SOLN
INTRAVENOUS | Status: DC | PRN
  Administered 2017-01-28: 07:00:00 via INTRAVENOUS

## 2017-01-28 MED ORDER — MIDAZOLAM HCL 2 MG/2ML IJ SOLN
INTRAMUSCULAR | Status: AC
Start: 2017-01-28 — End: 2017-01-28
  Filled 2017-01-28: qty 2

## 2017-01-28 MED ORDER — HEPARIN SODIUM (PORCINE) 5000 UNIT/ML IJ SOLN
5000.0000 [IU] | Freq: Once | INTRAMUSCULAR | Status: AC
  Administered 2017-01-28: 5000 [IU] via SUBCUTANEOUS
  Filled 2017-01-28: qty 1

## 2017-01-28 MED ORDER — KETAMINE HCL 10 MG/ML IJ SOLN
INTRAMUSCULAR | Status: DC | PRN
  Administered 2017-01-28: 30 mg via INTRAVENOUS

## 2017-01-28 MED ORDER — DEXAMETHASONE SODIUM PHOSPHATE 10 MG/ML IJ SOLN
INTRAMUSCULAR | Status: DC | PRN
  Administered 2017-01-28: 8 mg via INTRAVENOUS

## 2017-01-28 MED ORDER — ACETAMINOPHEN 500 MG PO TABS
1000.0000 mg | ORAL_TABLET | ORAL | Status: AC
  Administered 2017-01-28: 1000 mg via ORAL
  Filled 2017-01-28: qty 2

## 2017-01-28 MED ORDER — SODIUM CHLORIDE 0.9 % IJ SOLN
INTRAMUSCULAR | Status: DC | PRN
  Administered 2017-01-28: 10 mL

## 2017-01-28 MED ORDER — PHENYLEPHRINE HCL 10 MG/ML IJ SOLN
INTRAMUSCULAR | Status: AC
  Filled 2017-01-28: qty 1

## 2017-01-28 MED ORDER — MIDAZOLAM HCL 5 MG/5ML IJ SOLN
INTRAMUSCULAR | Status: DC | PRN
  Administered 2017-01-28: 2 mg via INTRAVENOUS

## 2017-01-28 MED ORDER — LIDOCAINE 2% (20 MG/ML) 5 ML SYRINGE
INTRAMUSCULAR | Status: AC
  Filled 2017-01-28: qty 10

## 2017-01-28 MED ORDER — PANTOPRAZOLE SODIUM 40 MG IV SOLR
40.0000 mg | Freq: Every day | INTRAVENOUS | Status: DC
  Administered 2017-01-28 – 2017-01-30 (×3): 40 mg via INTRAVENOUS
  Filled 2017-01-28 (×3): qty 40

## 2017-01-28 MED ORDER — KETAMINE HCL 10 MG/ML IJ SOLN
INTRAMUSCULAR | Status: AC
  Filled 2017-01-28: qty 1

## 2017-01-28 MED ORDER — ACETAMINOPHEN 10 MG/ML IV SOLN
1000.0000 mg | Freq: Three times a day (TID) | INTRAVENOUS | Status: AC | PRN
  Administered 2017-01-28 – 2017-01-29 (×3): 1000 mg via INTRAVENOUS
  Filled 2017-01-28 (×3): qty 100

## 2017-01-28 MED ORDER — SCOPOLAMINE 1 MG/3DAYS TD PT72
MEDICATED_PATCH | TRANSDERMAL | Status: AC
  Filled 2017-01-28: qty 1

## 2017-01-28 MED ORDER — SCOPOLAMINE 1 MG/3DAYS TD PT72
MEDICATED_PATCH | TRANSDERMAL | Status: DC | PRN
  Administered 2017-01-28: 1 via TRANSDERMAL

## 2017-01-28 MED ORDER — ROCURONIUM BROMIDE 10 MG/ML (PF) SYRINGE
PREFILLED_SYRINGE | INTRAVENOUS | Status: DC | PRN
  Administered 2017-01-28 (×2): 20 mg via INTRAVENOUS
  Administered 2017-01-28: 10 mg via INTRAVENOUS
  Administered 2017-01-28: 20 mg via INTRAVENOUS
  Administered 2017-01-28: 50 mg via INTRAVENOUS

## 2017-01-28 MED ORDER — FENTANYL CITRATE (PF) 100 MCG/2ML IJ SOLN: 25.0000 ug | INTRAMUSCULAR | Status: DC | PRN

## 2017-01-28 MED ORDER — FENTANYL CITRATE (PF) 250 MCG/5ML IJ SOLN
INTRAMUSCULAR | Status: DC | PRN
  Administered 2017-01-28: 100 ug via INTRAVENOUS

## 2017-01-28 MED ORDER — GABAPENTIN 300 MG PO CAPS
300.0000 mg | ORAL_CAPSULE | ORAL | Status: AC
  Administered 2017-01-28: 300 mg via ORAL
  Filled 2017-01-28: qty 1

## 2017-01-28 MED ORDER — DIPHENHYDRAMINE HCL 12.5 MG/5ML PO ELIX: 12.5000 mg | ORAL_SOLUTION | Freq: Four times a day (QID) | ORAL | Status: DC | PRN

## 2017-01-28 MED ORDER — DEXAMETHASONE SODIUM PHOSPHATE 10 MG/ML IJ SOLN
INTRAMUSCULAR | Status: AC
  Filled 2017-01-28: qty 1

## 2017-01-28 MED ORDER — PROCHLORPERAZINE EDISYLATE 5 MG/ML IJ SOLN
5.0000 mg | Freq: Four times a day (QID) | INTRAMUSCULAR | Status: DC | PRN
  Administered 2017-01-29 – 2017-01-30 (×3): 10 mg via INTRAVENOUS
  Filled 2017-01-28 (×3): qty 2

## 2017-01-28 MED ORDER — PHENYLEPHRINE 40 MCG/ML (10ML) SYRINGE FOR IV PUSH (FOR BLOOD PRESSURE SUPPORT)
PREFILLED_SYRINGE | INTRAVENOUS | Status: AC
  Filled 2017-01-28: qty 10

## 2017-01-28 MED ORDER — DIPHENHYDRAMINE HCL 50 MG/ML IJ SOLN: 12.5000 mg | Freq: Four times a day (QID) | INTRAMUSCULAR | Status: DC | PRN

## 2017-01-28 MED ORDER — KCL IN DEXTROSE-NACL 20-5-0.45 MEQ/L-%-% IV SOLN
INTRAVENOUS | Status: DC
  Administered 2017-01-28 – 2017-01-31 (×5): via INTRAVENOUS
  Filled 2017-01-28 (×8): qty 1000

## 2017-01-28 MED ORDER — LIDOCAINE 2% (20 MG/ML) 5 ML SYRINGE
INTRAMUSCULAR | Status: DC | PRN
  Administered 2017-01-28: 1.5 mg/kg/h via INTRAVENOUS

## 2017-01-28 SURGICAL SUPPLY — 55 items
APPLIER CLIP ROT 10 11.4 M/L (STAPLE)
BENZOIN TINCTURE PRP APPL 2/3 (GAUZE/BANDAGES/DRESSINGS) IMPLANT
CABLE HIGH FREQUENCY MONO STRZ (ELECTRODE) ×3 IMPLANT
CATH FOLEY 2WAY SLVR 30CC 22FR (CATHETERS) ×3 IMPLANT
CLIP APPLIE ROT 10 11.4 M/L (STAPLE) IMPLANT
CLOSURE WOUND 1/2 X4 (GAUZE/BANDAGES/DRESSINGS)
COVER SURGICAL LIGHT HANDLE (MISCELLANEOUS) ×3 IMPLANT
DECANTER SPIKE VIAL GLASS SM (MISCELLANEOUS) ×3 IMPLANT
DERMABOND ADVANCED (GAUZE/BANDAGES/DRESSINGS) ×2
DERMABOND ADVANCED .7 DNX12 (GAUZE/BANDAGES/DRESSINGS) ×1 IMPLANT
DEVICE SUT QUICK LOAD TK 5 (STAPLE) ×4 IMPLANT
DEVICE SUT TI-KNOT TK 5X26 (MISCELLANEOUS) ×2 IMPLANT
DEVICE SUTURE ENDOST 10MM (ENDOMECHANICALS) ×3 IMPLANT
DEVICE TI KNOT TK5 (MISCELLANEOUS) ×1
DEVICE TROCAR PUNCTURE CLOSURE (ENDOMECHANICALS) ×3 IMPLANT
DISSECTOR BLUNT TIP ENDO 5MM (MISCELLANEOUS) ×3 IMPLANT
DRAIN PENROSE 18X1/2 LTX STRL (DRAIN) ×3 IMPLANT
ELECT PENCIL ROCKER SW 15FT (MISCELLANEOUS) IMPLANT
ELECT REM PT RETURN 15FT ADLT (MISCELLANEOUS) ×3 IMPLANT
ENDOSTITCH 0 SINGLE 48 (SUTURE) ×24 IMPLANT
FELT TEFLON 4 X1 (Mesh General) ×3 IMPLANT
GLOVE BIOGEL M 8.0 STRL (GLOVE) ×3 IMPLANT
GOWN STRL REUS W/TWL XL LVL3 (GOWN DISPOSABLE) ×12 IMPLANT
GRASPER ENDO BABCOCK 10 (MISCELLANEOUS) ×1 IMPLANT
GRASPER ENDO BABCOCK 10MM (MISCELLANEOUS) ×2
IRRIG SUCT STRYKERFLOW 2 WTIP (MISCELLANEOUS) ×3
IRRIGATION SUCT STRKRFLW 2 WTP (MISCELLANEOUS) ×1 IMPLANT
KIT BASIN OR (CUSTOM PROCEDURE TRAY) ×3 IMPLANT
NEEDLE SPNL 22GX3.5 QUINCKE BK (NEEDLE) ×3 IMPLANT
PAD POSITIONING PINK XL (MISCELLANEOUS) ×3 IMPLANT
POSITIONER SURGICAL ARM (MISCELLANEOUS) IMPLANT
QUICK LOAD TK 5 (STAPLE) ×2
SCISSORS LAP 5X45 EPIX DISP (ENDOMECHANICALS) ×3 IMPLANT
SCRUB TECHNI CARE 4 OZ NO DYE (MISCELLANEOUS) ×3 IMPLANT
SHEARS HARMONIC ACE PLUS 45CM (MISCELLANEOUS) ×3 IMPLANT
SLEEVE ADV FIXATION 5X100MM (TROCAR) ×6 IMPLANT
SOLUTION ANTI FOG 6CC (MISCELLANEOUS) ×3 IMPLANT
STAPLER VISISTAT 35W (STAPLE) ×3 IMPLANT
STRIP CLOSURE SKIN 1/2X4 (GAUZE/BANDAGES/DRESSINGS) IMPLANT
SUT SILK 2 0 SH (SUTURE) ×3 IMPLANT
SUT SURGIDAC NAB ES-9 0 48 120 (SUTURE) ×12 IMPLANT
SUT VIC AB 4-0 SH 18 (SUTURE) ×3 IMPLANT
TAPE CLOTH 4X10 WHT NS (GAUZE/BANDAGES/DRESSINGS) IMPLANT
TIP INNERVISION DETACH 40FR (MISCELLANEOUS) IMPLANT
TIP INNERVISION DETACH 50FR (MISCELLANEOUS) IMPLANT
TIP INNERVISION DETACH 56FR (MISCELLANEOUS) IMPLANT
TIPS INNERVISION DETACH 40FR (MISCELLANEOUS)
TOWEL OR 17X26 10 PK STRL BLUE (TOWEL DISPOSABLE) ×3 IMPLANT
TRAY FOLEY BAG SILVER LF 14FR (CATHETERS) ×3 IMPLANT
TRAY FOLEY W/METER SILVER 16FR (SET/KITS/TRAYS/PACK) IMPLANT
TRAY LAPAROSCOPIC (CUSTOM PROCEDURE TRAY) ×3 IMPLANT
TROCAR ADV FIXATION 11X100MM (TROCAR) ×3 IMPLANT
TROCAR ADV FIXATION 5X100MM (TROCAR) ×3 IMPLANT
TROCAR BLADELESS OPT 5 100 (ENDOMECHANICALS) ×3 IMPLANT
TUBING INSUF HEATED (TUBING) ×3 IMPLANT

## 2017-01-28 NOTE — Transfer of Care (Signed)
Immediate Anesthesia Transfer of Care Note  Patient: Janice Orr  Procedure(s) Performed: Procedure(s): LAPAROSCOPIC REPAIR OF LARGE HIATAL HERNIA (N/A)  Patient Location: PACU  Anesthesia Type:General  Level of Consciousness:  sedated, patient cooperative and responds to stimulation  Airway & Oxygen Therapy:Patient Spontanous Breathing and Patient connected to face mask oxgen  Post-op Assessment:  Report given to PACU RN and Post -op Vital signs reviewed and stable  Post vital signs:  Reviewed and stable  Last Vitals:  Vitals:   01/28/17 0607  BP: 126/84  Pulse: 87  Resp: 16  Temp: 95.6 C    Complications: No apparent anesthesia complications

## 2017-01-28 NOTE — Interval H&P Note (Signed)
History and Physical Interval Note:  01/28/2017 7:14 AM  Janice Orr  has presented today for surgery, with the diagnosis of LARGE HIATAL HERNIA   The various methods of treatment have been discussed with the patient and family. After consideration of risks, benefits and other options for treatment, the patient has consented to  Procedure(s): [place] (N/A) as a surgical intervention .  I have reviewed this with her and our goals of surgery.    The patient's history has been reviewed, patient examined, no change in status, stable for surgery.  I have reviewed the patient's chart and labs.  Questions were answered to the patient's satisfaction.     [name] B

## 2017-01-28 NOTE — Op Note (Signed)
NAMEILLEANA, EDICK              ACCOUNT NO.:  [id number]  MEDICAL RECORD NO.:  [id number]  LOCATION:  [facility]                         FACILITY:  [facility]  PHYSICIAN:  Isabel Caprice. Hassell Done, MD  DATE OF BIRTH:  [birth year]  DATE OF PROCEDURE:  01/28/2017 DATE OF DISCHARGE:                              OPERATIVE REPORT   PREOPERATIVE DIAGNOSIS:  A 61 year old white female with all of her stomach and her chest with a history of severe dysphagia.  POSTOPERATIVE DIAGNOSIS:  A 61 year old white female with all of her stomach and her chest with a history of severe dysphagia.  PROCEDURE:  Laparoscopic takedown of type IV hiatal hernia, upper endoscopy, and closure of the hiatus and gastropexy.  SURGEON:  Isabel Caprice. Hassell Done, MD.  ASSISTANT:  Leighton Ruff.  DESCRIPTION OF PROCEDURE:  Ms. Elms was taken to room 2 at [facility] and given general anesthesia.  The abdomen was prepped with Techni-Care and draped.  A Foley had been inserted.  Access to the abdomen was achieved through the left upper quadrant by the 5 mm Optiview without difficulty.  Following insufflation, the standard port placements were used using initially just 5 mm ports, and an upper midline Nathanson retractor to retract the liver.  This revealed a huge defect that happened to contain all of the omentum and the entire stomach.  Our visualization showed the transverse colon pulled up to the edge of the diaphragm, and upon reduction, the massive omentum was brought below and then the stomach was partially reduced.  I then performed harmonic scalpel dissection to excise most of the sac with the stomach going up into the chest and taking this off the aorta posteriorly and laterally and on both the right and left sides.  There were a lot of adhesions, which were teased away or divided with electrocautery.  When I had this mobilized, I went ahead and broke out and passed the endoscope to verify anatomy.  She did seem to have some  foreshortening of her esophagus and the esophagogastric junction was really going to be somewhat tight.  In addition, she had a very broad-based defect and closure of this was going to be questionable.  There was no evidence of any perforations.  I elected to leave the scope in for the time being and went back and further dissected to get all the adhesions down from the distal esophagus and free up the EG junction.  I then began closing the hiatus posteriorly using a pledget and tie knot and then alternated with tie knots and free ties using the tying Babcock.  Four such sutures were placed and the muscle on the right crus began to tear.  I went up and made a relaxing incision, but felt at this point, that there was going to be too much tension to put any further sutures in.  There still remained some a bit of a defect, but again, we did not have the lighted bougie in place; however, it was felt that this was with relaxing incision about as tight as we can make this closure. I did not feel that a Nissen at this point, would be well tolerated and would likely slight above  the diaphragm.  Therefore, I went ahead and found the left upper quadrant port and brought in a 22 Foley with a 30 mL balloon.  I made a pursestring in the stomach and then opened the stomach with a harmonic scalpel.  The Foley was inserted and the pursestring suture was tied down around it.  There was a 0 Vicryl pursestring.  It was then affixed to the anterior abdominal wall with 2 sutures of 2-0 Surgidac placed through percutaneously and tied on either side of the Foley.  The Foley was then secured to the skin with 2 sutures of 2-0 silk tying back to the tubing.  The stomach seemed to lay in a vertical position with a linear from the esophagus down into the stomach emptying in the antrum with the structures all and below the diaphragm.  The liver found its place on top of the hiatus.  I injected the port sites with  Exparel.  The wounds were closed with 4-0 Monocryl and with Dermabond.  The patient seemed to tolerate the procedure well and was taken to the recovery room in satisfactory condition.     Isabel Caprice Hassell Done, MD     MBM/MEDQ  D:  01/28/2017  T:  01/28/2017  Job:  [id number]  cc:   Nelwyn Salisbury, MD, [facility] Fax: [phone]

## 2017-01-28 NOTE — Brief Op Note (Signed)
01/28/2017  11:09 AM  PATIENT:  Janice Orr  61 y.o. female  PRE-OPERATIVE DIAGNOSIS:  LARGE Type IV  HIATAL HERNIA   POST-OPERATIVE DIAGNOSIS:  LARGE Type IV  HIATAL HERNIA   PROCEDURE:  Procedure(s): LAPAROSCOPIC REPAIR OF LARGE HIATAL HERNIA (N/A)  SURGEON:  Surgeon(s) and Role:    Johnathan Hausen, MD - Primary    * Leighton Ruff, MD - Assisting  PHYSICIAN ASSISTANT:   ASSISTANTS: Dr. Marcello Moores   ANESTHESIA:   general  EBL:  Total I/O In: 1000 [I.V.:1000] Out: 250 [Urine:200; Blood:50]  BLOOD ADMINISTERED:none  DRAINS: none   LOCAL MEDICATIONS USED:  BUPIVICAINE   SPECIMEN:  No Specimen  DISPOSITION OF SPECIMEN:  N/A  COUNTS:  YES  TOURNIQUET:  * No tourniquets in log *  DICTATION: .Other Dictation: Dictation Number [id number]  PLAN OF CARE: Admit to inpatient   PATIENT DISPOSITION:  PACU - hemodynamically stable.   Delay start of Pharmacological VTE agent (>24hrs) due to surgical blood loss or risk of bleeding: no

## 2017-01-28 NOTE — Anesthesia Procedure Notes (Signed)
Procedure Name: Intubation Date/Time: 01/28/2017 7:52 AM Performed by: Talbot Grumbling Pre-anesthesia Checklist: Patient identified, Suction available, Patient being monitored and Emergency Drugs available Patient Re-evaluated:Patient Re-evaluated prior to induction Oxygen Delivery Method: Circle system utilized Preoxygenation: Pre-oxygenation with 100% oxygen Induction Type: IV induction Ventilation: Mask ventilation without difficulty Laryngoscope Size: Mac and 3 Grade View: Grade I Tube type: Oral Tube size: 7.5 mm Airway Equipment and Method: Stylet Placement Confirmation: ETT inserted through vocal cords under direct vision,  positive ETCO2 and breath sounds checked- equal and bilateral Secured at: 22 cm Tube secured with: Tape Dental Injury: Teeth and Oropharynx as per pre-operative assessment

## 2017-01-28 NOTE — Anesthesia Postprocedure Evaluation (Signed)
Anesthesia Post Note  Patient: Janice Orr  Procedure(s) Performed: Procedure(s) (LRB): LAPAROSCOPIC REPAIR OF LARGE HIATAL HERNIA (N/A)     Patient location during evaluation: PACU Anesthesia Type: General Level of consciousness: awake and alert Pain management: pain level controlled Vital Signs Assessment: post-procedure vital signs reviewed and stable Respiratory status: spontaneous breathing, nonlabored ventilation and respiratory function stable Cardiovascular status: blood pressure returned to baseline and stable Postop Assessment: no signs of nausea or vomiting Anesthetic complications: no    Last Vitals:  Vitals:   01/28/17 1230 01/28/17 1330  BP: 110/61 120/72  Pulse: 72 75  Resp: 16 17  Temp: (!) 36.3 C 36.6 C    Last Pain:  Vitals:   01/28/17 1330  TempSrc: Axillary  PainSc:                  Catalina Gravel

## 2017-01-28 NOTE — Anesthesia Preprocedure Evaluation (Addendum)
Anesthesia Evaluation  Patient identified by MRN, date of birth, ID band Patient awake    Reviewed: Allergy & Precautions, NPO status , Patient's Chart, lab work & pertinent test results, reviewed documented beta blocker date and time   History of Anesthesia Complications (+) PONV and history of anesthetic complications  Airway Mallampati: II  TM Distance: >3 FB Neck ROM: Full    Dental  (+) Teeth Intact, Dental Advisory Given, Implants, Missing,    Pulmonary neg pulmonary ROS,    Pulmonary exam normal breath sounds clear to auscultation       Cardiovascular hypertension, Pt. on home beta blockers (-) angina(-) CAD and (-) Past MI Normal cardiovascular exam Rhythm:Regular Rate:Normal     Neuro/Psych PSYCHIATRIC DISORDERS Anxiety Depression negative neurological ROS     GI/Hepatic Neg liver ROS, hiatal hernia, GERD  Medicated,  Endo/Other  Obesity   Renal/GU negative Renal ROS     Musculoskeletal  (+) Arthritis ,   Abdominal   Peds  Hematology negative hematology ROS (+)   Anesthesia Other Findings Day of surgery medications reviewed with the patient.  Reproductive/Obstetrics                           Anesthesia Physical Anesthesia Plan  ASA: II  Anesthesia Plan: General   Post-op Pain Management:    Induction: Intravenous  PONV Risk Score and Plan: 4 or greater and Ondansetron, Dexamethasone, Midazolam, Scopolamine patch - Pre-op, Promethazine and Propofol infusion  Airway Management Planned: Oral ETT  Additional Equipment:   Intra-op Plan:   Post-operative Plan: Extubation in OR  Informed Consent: I have reviewed the patients History and Physical, chart, labs and discussed the procedure including the risks, benefits and alternatives for the proposed anesthesia with the patient or authorized representative who has indicated his/her understanding and acceptance.   Dental  advisory given  Plan Discussed with: CRNA  Anesthesia Plan Comments: (2nd IV after induction.  Propofol infusion.)       Anesthesia Quick Evaluation

## 2017-01-29 LAB — CBC
HEMATOCRIT: 37.2 % (ref 36.0–46.0)
HEMOGLOBIN: 12.1 g/dL (ref 12.0–15.0)
MCH: 29.7 pg (ref 26.0–34.0)
MCHC: 32.5 g/dL (ref 30.0–36.0)
MCV: 91.4 fL (ref 78.0–100.0)
Platelets: 269 10*3/uL (ref 150–400)
RBC: 4.07 MIL/uL (ref 3.87–5.11)
RDW: 13.2 % (ref 11.5–15.5)
WBC: 12.8 10*3/uL — ABNORMAL HIGH (ref 4.0–10.5)

## 2017-01-29 MED ORDER — KETOROLAC TROMETHAMINE 30 MG/ML IJ SOLN
30.0000 mg | Freq: Once | INTRAMUSCULAR | Status: AC
  Administered 2017-01-29: 30 mg via INTRAVENOUS
  Filled 2017-01-29: qty 1

## 2017-01-29 MED ORDER — LORAZEPAM 0.5 MG PO TABS
0.2500 mg | ORAL_TABLET | Freq: Three times a day (TID) | ORAL | Status: DC | PRN
  Administered 2017-01-30 – 2017-01-31 (×4): 0.5 mg via ORAL
  Filled 2017-01-29 (×4): qty 1

## 2017-01-29 MED ORDER — ZOLPIDEM TARTRATE 5 MG PO TABS: 5.0000 mg | ORAL_TABLET | Freq: Every day | ORAL | Status: DC

## 2017-01-29 MED ORDER — VENLAFAXINE HCL ER 150 MG PO CP24
150.0000 mg | ORAL_CAPSULE | Freq: Every day | ORAL | Status: DC
  Administered 2017-01-29 – 2017-01-31 (×3): 150 mg via ORAL
  Filled 2017-01-29 (×3): qty 1

## 2017-01-29 MED ORDER — FLUTICASONE PROPIONATE 50 MCG/ACT NA SUSP: 1.0000 | Freq: Two times a day (BID) | NASAL | Status: DC | PRN

## 2017-01-29 MED ORDER — METOPROLOL SUCCINATE ER 25 MG PO TB24
25.0000 mg | ORAL_TABLET | Freq: Every day | ORAL | Status: DC
  Administered 2017-01-29 – 2017-01-31 (×3): 25 mg via ORAL
  Filled 2017-01-29 (×3): qty 1

## 2017-01-29 MED ORDER — ZOLPIDEM TARTRATE 5 MG PO TABS: 5.0000 mg | ORAL_TABLET | Freq: Every evening | ORAL | Status: DC | PRN

## 2017-01-29 NOTE — Progress Notes (Signed)
Dr. Hassell Done called.  Stated that patient may have small sips of clear liquids to see if patient can tolerate.  Will place order for clear liquid diet

## 2017-01-29 NOTE — Progress Notes (Signed)
Patient ID: Janice Orr, female   DOB: May 05, 1956, 61 y.o.   MRN: [id number] [facility] Progress Note:   1 Day Post-Op  Subjective: Mental status is clear.   Objective: Vital signs in last 24 hours: Temp:  [97.8 F (36.6 C)-100.6 F (38.1 C)] 98.1 F (36.7 C) (08/03 1000) Pulse Rate:  [70-84] 70 (08/03 1000) Resp:  [17-18] 17 (08/03 1000) BP: (120-152)/(72-82) 152/79 (08/03 1000) SpO2:  [94 %-100 %] 98 % (08/03 1000)  Intake/Output from previous day: 08/02 0701 - 08/03 0700 In: 4160 [I.V.:4060; IV Piggyback:100] Out: 5180 [Urine:4900; Drains:230; Blood:50] Intake/Output this shift: Total I/O In: 600 [I.V.:600] Out: 520 [Urine:500; Drains:20]  Physical Exam: Work of breathing is normal.  G tube with green contents of stomach  Lab Results:  Results for orders placed or performed during the hospital encounter of 01/28/17 (from the past 48 hour(s))  CBC     Status: Abnormal   Collection Time: 01/28/17  2:28 PM  Result Value Ref Range   WBC 10.7 (H) 4.0 - 10.5 K/uL   RBC 4.07 3.87 - 5.11 MIL/uL   Hemoglobin 12.1 12.0 - 15.0 g/dL   HCT 36.8 36.0 - 46.0 %   MCV 90.4 78.0 - 100.0 fL   MCH 29.7 26.0 - 34.0 pg   MCHC 32.9 30.0 - 36.0 g/dL   RDW 12.9 11.5 - 15.5 %   Platelets 236 150 - 400 K/uL  Creatinine, serum     Status: None   Collection Time: 01/28/17  2:28 PM  Result Value Ref Range   Creatinine, Ser 0.92 0.44 - 1.00 mg/dL   GFR calc non Af Amer >60 >60 mL/min   GFR calc Af Amer >60 >60 mL/min    Comment: (NOTE) The eGFR has been calculated using the CKD EPI equation. This calculation has not been validated in all clinical situations. eGFR's persistently <60 mL/min signify possible Chronic Kidney Disease.   CBC     Status: Abnormal   Collection Time: 01/29/17  5:21 AM  Result Value Ref Range   WBC 12.8 (H) 4.0 - 10.5 K/uL   RBC 4.07 3.87 - 5.11 MIL/uL   Hemoglobin 12.1 12.0 - 15.0 g/dL   HCT 37.2 36.0 - 46.0 %   MCV 91.4 78.0 - 100.0 fL   MCH  29.7 26.0 - 34.0 pg   MCHC 32.5 30.0 - 36.0 g/dL   RDW 13.2 11.5 - 15.5 %   Platelets 269 150 - 400 K/uL    Radiology/Results: No results found.  Anti-infectives: Anti-infectives    Start     Dose/Rate Route Frequency Ordered Stop   01/28/17 1600  ceFAZolin (ANCEF) IVPB 2g/100 mL premix     2 g 200 mL/hr over 30 Minutes Intravenous Every 8 hours 01/28/17 1218 01/28/17 1719   01/28/17 0600  ceFAZolin (ANCEF) 3 g in dextrose 5 % 50 mL IVPB     3 g 130 mL/hr over 30 Minutes Intravenous On call to O.R. 01/28/17 0549 01/28/17 0810      Assessment/Plan: Problem List: Patient Active Problem List   Diagnosis Date Noted  . History of repair of hiatal hernia with tube gastrostomy August 2018 01/28/2017  . Arthritis of right hip 10/30/2013    Begun on clear liquids and restarted oral meds.  Would assess for discharge Sat or Sun with tube clamped and on clear liquids for a week.   1 Day Post-Op    LOS: 1 day   Matt B. Hassell Done, MD, FACS  [facility] [phone] beeper [phone]  01/29/2017 12:59 PM

## 2017-01-30 MED ORDER — HYDROCODONE-ACETAMINOPHEN 5-325 MG PO TABS
1.0000 | ORAL_TABLET | ORAL | Status: DC | PRN
  Administered 2017-01-30 – 2017-01-31 (×4): 1 via ORAL
  Filled 2017-01-30 (×4): qty 1

## 2017-01-30 NOTE — Progress Notes (Signed)
  Progress Note: General Surgery Service   Assessment/Plan: Patient Active Problem List   Diagnosis Date Noted  . History of repair of hiatal hernia with tube gastrostomy August 2018 01/28/2017  . Arthritis of right hip 10/30/2013   s/p Procedure(s): LAPAROSCOPIC REPAIR OF LARGE HIATAL HERNIA 01/28/2017  Oral pain medications Encourage clear liquids -may require G tube feeding for nutrition while her GEJ heals   LOS: 2 days  Chief Complaint/Subjective: Tolerated small amounts of liquids but having heartburn like symptoms  Objective: Vital signs in last 24 hours: Temp:  [98.1 F (36.7 C)-98.9 F (37.2 C)] 98.1 F (36.7 C) (08/04 0510) Pulse Rate:  [70-99] 81 (08/04 0510) Resp:  [18] 18 (08/04 0510) BP: (119-144)/(78-82) 119/82 (08/04 0510) SpO2:  [94 %-97 %] 94 % (08/04 0510) Last BM Date:  (PTA)  Intake/Output from previous day: 08/03 0701 - 08/04 0700 In: 2540 [P.O.:240; I.V.:2100; IV Piggyback:200] Out: 8828 [Urine:3100; Drains:335] Intake/Output this shift: No intake/output data recorded.  Lungs: CTAB  Cardiovascular: RRR  Abd: soft, Nt, Nd, incisions c/d/i  Extremities: no edema  Neuro: AOx4  Lab Results: CBC   Recent Labs  01/28/17 1428 01/29/17 0521  WBC 10.7* 12.8*  HGB 12.1 12.1  HCT 36.8 37.2  PLT 236 269   BMET  Recent Labs  01/28/17 1428  CREATININE 0.92   PT/INR No results for input(s): LABPROT, INR in the last 72 hours. ABG No results for input(s): PHART, HCO3 in the last 72 hours.  Invalid input(s): PCO2, PO2  Studies/Results:  Anti-infectives: Anti-infectives    Start     Dose/Rate Route Frequency Ordered Stop   01/28/17 1600  ceFAZolin (ANCEF) IVPB 2g/100 mL premix     2 g 200 mL/hr over 30 Minutes Intravenous Every 8 hours 01/28/17 1218 01/28/17 1719   01/28/17 0600  ceFAZolin (ANCEF) 3 g in dextrose 5 % 50 mL IVPB     3 g 130 mL/hr over 30 Minutes Intravenous On call to O.R. 01/28/17 0549 01/28/17 0810       Medications: Scheduled Meds: . heparin subcutaneous  5,000 Units Subcutaneous Q8H  . metoprolol succinate  25 mg Oral Daily  . pantoprazole (PROTONIX) IV  40 mg Intravenous QHS  . venlafaxine XR  150 mg Oral Daily   Continuous Infusions: . dextrose 5 % and 0.45 % NaCl with KCl 20 mEq/L 100 mL/hr at 01/30/17 0259   PRN Meds:.diphenhydrAMINE **OR** diphenhydrAMINE, fentaNYL (SUBLIMAZE) injection, fluticasone, hydrALAZINE, HYDROcodone-acetaminophen, LORazepam, metoprolol tartrate, ondansetron **OR** ondansetron (ZOFRAN) IV, prochlorperazine **OR** prochlorperazine, zolpidem  Mickeal Skinner, MD Pg# [phone] [facility]

## 2017-01-31 LAB — CBC
HEMATOCRIT: 36.6 % (ref 36.0–46.0)
HEMOGLOBIN: 11.8 g/dL — AB (ref 12.0–15.0)
MCH: 29.3 pg (ref 26.0–34.0)
MCHC: 32.2 g/dL (ref 30.0–36.0)
MCV: 90.8 fL (ref 78.0–100.0)
Platelets: 272 10*3/uL (ref 150–400)
RBC: 4.03 MIL/uL (ref 3.87–5.11)
RDW: 13.3 % (ref 11.5–15.5)
WBC: 8.6 10*3/uL (ref 4.0–10.5)

## 2017-01-31 MED ORDER — HYDROCODONE-ACETAMINOPHEN 5-325 MG PO TABS: 1.0000 | ORAL_TABLET | ORAL | 0 refills | Status: AC | PRN

## 2017-01-31 MED ORDER — ONDANSETRON 4 MG PO TBDP: 4.0000 mg | ORAL_TABLET | Freq: Four times a day (QID) | ORAL | 0 refills | Status: AC | PRN

## 2017-01-31 NOTE — Progress Notes (Signed)
Assessment unchanged. Gastric tube clamped and dressing changed at drain site. Drain sponge gauze supplied for pt and husband to change at home with verbalized understanding. Both verbalized "we are to continue full liquid diet and Ensure until follow up visit. We can open clamp and let gas pass if she feels bloated." Verbalized understanding of when to call the MD if any problems. Scripts given as provided by MD. Discharged via wc to front entrance accompanied by NT and family.

## 2017-01-31 NOTE — Discharge Summary (Signed)
Physician Discharge Summary  Janice Orr:[id number] DOB: January 18, 1956 DOA: 01/28/2017  PCP: Kathyrn Lass, MD  Admit date: 01/28/2017 Discharge date: 01/31/2017  Recommendations for Outpatient Follow-up:  1.  (include homehealth, outpatient follow-up instructions, specific recommendations for PCP to follow-up on, etc.)  Follow-up Information    Johnathan Hausen, MD Follow up in 3 week(s).   Specialty:  General Surgery Contact information: [place] [address] [phone]          Discharge Diagnoses:  Principal Problem:   History of repair of hiatal hernia with tube gastrostomy August 2018   Surgical Procedure: lap hiatal hernia repair and g tube placement  Discharge Condition: Good Disposition: Home  Diet recommendation: full liquids and ensure   Hospital Course:  61 yo female presented to surgery for lap hiatal hernia repair. After surgery she had difficulty with liquids POD 1 and then was slowly able to advance. She was discharged home POD 3.  Discharge Instructions  Discharge Instructions    Call MD for:  difficulty breathing, headache or visual disturbances    Complete by:  As directed    Call MD for:  persistant nausea and vomiting    Complete by:  As directed    Call MD for:  redness, tenderness, or signs of infection (pain, swelling, redness, odor or green/yellow discharge around incision site)    Complete by:  As directed    Call MD for:  severe uncontrolled pain    Complete by:  As directed    Call MD for:  temperature >100.4    Complete by:  As directed    Diet general    Complete by:  As directed    Full liquids and ensure   Discharge wound care:    Complete by:  As directed    Ok to leave incisions open, ok to shower. Keep g tube plugged, ok to vent if bloated   Increase activity slowly    Complete by:  As directed      Allergies as of 01/31/2017      Reactions   Watermelon Flavor Anaphylaxis, Hives   Claritin-d  12 Hour [loratadine-pseudoephedrine Er] Other (See Comments)   Possible allergic reaction   Codeine Hives   Hallucinations   Z-pak [azithromycin] Hives, Diarrhea      Medication List    TAKE these medications   DEXILANT 60 MG capsule Generic drug:  dexlansoprazole Take 60 mg by mouth daily.   fluticasone 50 MCG/ACT nasal spray Commonly known as:  FLONASE 1 spray each nares bid What changed:  how much to take  how to take this  when to take this  reasons to take this  additional instructions   HYDROcodone-acetaminophen 5-325 MG tablet Commonly known as:  NORCO/VICODIN Take 1 tablet by mouth every 4 (four) hours as needed for moderate pain.   ibuprofen 200 MG tablet Commonly known as:  ADVIL,MOTRIN Take 600-800 mg by mouth every 6 (six) hours as needed for headache or mild pain.   loratadine 10 MG tablet Commonly known as:  CLARITIN Take 10 mg by mouth daily.   LORazepam 0.5 MG tablet Commonly known as:  ATIVAN Take 0.25-0.5 mg by mouth every 8 (eight) hours as needed for anxiety.   metoprolol succinate 25 MG 24 hr tablet Commonly known as:  TOPROL-XL Take 25 mg by mouth daily.   ondansetron 4 MG disintegrating tablet Commonly known as:  ZOFRAN-ODT Take 1 tablet (4 mg total) by  mouth every 6 (six) hours as needed for nausea.   promethazine 25 MG tablet Commonly known as:  PHENERGAN Take 25 mg by mouth every 8 (eight) hours as needed for nausea or vomiting.   simvastatin 20 MG tablet Commonly known as:  ZOCOR Take 20 mg by mouth daily.   triamcinolone cream 0.1 % Commonly known as:  KENALOG Apply 1 application topically 2 (two) times daily as needed (for skin irritation or rash).   venlafaxine XR 150 MG 24 hr capsule Commonly known as:  EFFEXOR-XR Take 150 mg by mouth daily.   zolpidem 10 MG tablet Commonly known as:  AMBIEN Take 10 mg by mouth at bedtime.      Follow-up Information    Johnathan Hausen, MD Follow up in 3 week(s).   Specialty:   General Surgery Contact information: [place] [address] [phone]            The results of significant diagnostics from this hospitalization (including imaging, microbiology, ancillary and laboratory) are listed below for reference.    Significant Diagnostic Studies: No results found.  Labs: Basic Metabolic Panel:  Recent Labs Lab 01/28/17 1428  CREATININE 0.92   Liver Function Tests: No results for input(s): AST, ALT, ALKPHOS, BILITOT, PROT, ALBUMIN in the last 168 hours.  CBC:  Recent Labs Lab 01/28/17 1428 01/29/17 0521 01/31/17 0434  WBC 10.7* 12.8* 8.6  HGB 12.1 12.1 11.8*  HCT 36.8 37.2 36.6  MCV 90.4 91.4 90.8  PLT 236 269 272    CBG: No results for input(s): GLUCAP in the last 168 hours.  Principal Problem:   History of repair of hiatal hernia with tube gastrostomy August 2018   Time coordinating discharge: <38min

## 2017-04-07 ENCOUNTER — Other Ambulatory Visit: Payer: Self-pay | Admitting: Surgery

## 2017-04-07 ENCOUNTER — Inpatient Hospital Stay (HOSPITAL_COMMUNITY)
Admission: AD | Admit: 2017-04-07 | Discharge: 2017-04-14 | DRG: 392 | Disposition: A | Discharge status: Home / Self Care | Payer: 59 | Source: Ambulatory Visit | Attending: Surgery | Admitting: Surgery

## 2017-04-07 ENCOUNTER — Encounter (HOSPITAL_COMMUNITY): Payer: Self-pay | Admitting: *Deleted

## 2017-04-07 DIAGNOSIS — K52839 Microscopic colitis, unspecified: Principal | ICD-10-CM | POA: Diagnosis present

## 2017-04-07 DIAGNOSIS — Z96641 Presence of right artificial hip joint: Secondary | ICD-10-CM | POA: Diagnosis present

## 2017-04-07 DIAGNOSIS — Z79899 Other long term (current) drug therapy: Secondary | ICD-10-CM

## 2017-04-07 DIAGNOSIS — K5730 Diverticulosis of large intestine without perforation or abscess without bleeding: Secondary | ICD-10-CM | POA: Diagnosis present

## 2017-04-07 DIAGNOSIS — R197 Diarrhea, unspecified: Secondary | ICD-10-CM

## 2017-04-07 DIAGNOSIS — Z7951 Long term (current) use of inhaled steroids: Secondary | ICD-10-CM

## 2017-04-07 DIAGNOSIS — Z23 Encounter for immunization: Secondary | ICD-10-CM

## 2017-04-07 DIAGNOSIS — E869 Volume depletion, unspecified: Secondary | ICD-10-CM | POA: Diagnosis present

## 2017-04-07 DIAGNOSIS — E860 Dehydration: Secondary | ICD-10-CM | POA: Diagnosis present

## 2017-04-07 DIAGNOSIS — I10 Essential (primary) hypertension: Secondary | ICD-10-CM | POA: Diagnosis present

## 2017-04-07 LAB — CBC WITH DIFFERENTIAL/PLATELET
BASOS PCT: 0 %
Basophils Absolute: 0 10*3/uL (ref 0.0–0.1)
EOS ABS: 0.8 10*3/uL — AB (ref 0.0–0.7)
EOS PCT: 8 %
HCT: 43.3 % (ref 36.0–46.0)
Hemoglobin: 14 g/dL (ref 12.0–15.0)
LYMPHS ABS: 1.8 10*3/uL (ref 0.7–4.0)
Lymphocytes Relative: 18 %
MCH: 30.1 pg (ref 26.0–34.0)
MCHC: 32.3 g/dL (ref 30.0–36.0)
MCV: 93.1 fL (ref 78.0–100.0)
Monocytes Absolute: 0.8 10*3/uL (ref 0.1–1.0)
Monocytes Relative: 8 %
Neutro Abs: 6.9 10*3/uL (ref 1.7–7.7)
Neutrophils Relative %: 66 %
PLATELETS: 352 10*3/uL (ref 150–400)
RBC: 4.65 MIL/uL (ref 3.87–5.11)
RDW: 13 % (ref 11.5–15.5)
WBC: 10.3 10*3/uL (ref 4.0–10.5)

## 2017-04-07 LAB — COMPREHENSIVE METABOLIC PANEL
ALT: 16 U/L (ref 14–54)
AST: 20 U/L (ref 15–41)
Albumin: 4.3 g/dL (ref 3.5–5.0)
Alkaline Phosphatase: 163 U/L — ABNORMAL HIGH (ref 38–126)
Anion gap: 15 (ref 5–15)
BUN: 13 mg/dL (ref 6–20)
CHLORIDE: 103 mmol/L (ref 101–111)
CO2: 23 mmol/L (ref 22–32)
Calcium: 9.2 mg/dL (ref 8.9–10.3)
Creatinine, Ser: 0.97 mg/dL (ref 0.44–1.00)
GFR calc Af Amer: >60 mL/min (ref >60)
Glucose, Bld: 102 mg/dL — ABNORMAL HIGH (ref 65–99)
POTASSIUM: 3.3 mmol/L — AB (ref 3.5–5.1)
SODIUM: 141 mmol/L (ref 135–145)
Total Bilirubin: 0.3 mg/dL (ref 0.3–1.2)
Total Protein: 8 g/dL (ref 6.5–8.1)

## 2017-04-07 LAB — C DIFFICILE QUICK SCREEN W PCR REFLEX
C DIFFICILE (CDIFF) INTERP: NOT DETECTED
C DIFFICILE (CDIFF) TOXIN: NEGATIVE
C DIFFICLE (CDIFF) ANTIGEN: NEGATIVE

## 2017-04-07 MED ORDER — METOPROLOL SUCCINATE ER 25 MG PO TB24
25.0000 mg | ORAL_TABLET | Freq: Every day | ORAL | Status: DC
  Administered 2017-04-09 – 2017-04-14 (×6): 25 mg via ORAL
  Filled 2017-04-07 (×7): qty 1

## 2017-04-07 MED ORDER — ONDANSETRON 4 MG PO TBDP
4.0000 mg | ORAL_TABLET | Freq: Four times a day (QID) | ORAL | Status: DC | PRN
  Administered 2017-04-07 – 2017-04-12 (×7): 4 mg via ORAL
  Filled 2017-04-07 (×7): qty 1

## 2017-04-07 MED ORDER — ZOLPIDEM TARTRATE 5 MG PO TABS
5.0000 mg | ORAL_TABLET | Freq: Every day | ORAL | Status: DC
  Administered 2017-04-07 – 2017-04-13 (×5): 5 mg via ORAL
  Filled 2017-04-07 (×6): qty 1

## 2017-04-07 MED ORDER — PROMETHAZINE HCL 25 MG PO TABS
25.0000 mg | ORAL_TABLET | Freq: Three times a day (TID) | ORAL | Status: DC | PRN
  Administered 2017-04-07 – 2017-04-11 (×3): 25 mg via ORAL
  Filled 2017-04-07 (×4): qty 1

## 2017-04-07 MED ORDER — SIMVASTATIN 20 MG PO TABS
20.0000 mg | ORAL_TABLET | Freq: Every day | ORAL | Status: DC
  Administered 2017-04-08 – 2017-04-14 (×7): 20 mg via ORAL
  Filled 2017-04-07 (×7): qty 1

## 2017-04-07 MED ORDER — LORAZEPAM 0.5 MG PO TABS
0.2500 mg | ORAL_TABLET | Freq: Three times a day (TID) | ORAL | Status: DC | PRN
  Administered 2017-04-08 – 2017-04-13 (×6): 0.5 mg via ORAL
  Filled 2017-04-07 (×6): qty 1

## 2017-04-07 MED ORDER — PANTOPRAZOLE SODIUM 40 MG PO TBEC
40.0000 mg | DELAYED_RELEASE_TABLET | Freq: Every day | ORAL | Status: DC
  Administered 2017-04-08 – 2017-04-09 (×2): 40 mg via ORAL
  Filled 2017-04-07 (×2): qty 1

## 2017-04-07 MED ORDER — VENLAFAXINE HCL ER 150 MG PO CP24
150.0000 mg | ORAL_CAPSULE | Freq: Every day | ORAL | Status: DC
  Administered 2017-04-08 – 2017-04-14 (×7): 150 mg via ORAL
  Filled 2017-04-07 (×7): qty 1

## 2017-04-07 MED ORDER — FLUTICASONE PROPIONATE 50 MCG/ACT NA SUSP
1.0000 | Freq: Two times a day (BID) | NASAL | Status: DC | PRN
  Filled 2017-04-07: qty 16

## 2017-04-07 MED ORDER — HYDROCODONE-ACETAMINOPHEN 5-325 MG PO TABS
1.0000 | ORAL_TABLET | ORAL | Status: DC | PRN
  Administered 2017-04-07: 1 via ORAL
  Filled 2017-04-07: qty 1

## 2017-04-07 MED ORDER — DEXTROSE IN LACTATED RINGERS 5 % IV SOLN
INTRAVENOUS | Status: DC
  Administered 2017-04-07 (×2): via INTRAVENOUS
  Administered 2017-04-08: 125 mL/h via INTRAVENOUS
  Administered 2017-04-09 – 2017-04-11 (×5): via INTRAVENOUS

## 2017-04-07 NOTE — H&P (Signed)
Chief Complaint:  persistant unrelenting diarrhea  History of Present Illness:  Janice Orr is an 61 y.o. female who was seen in the [facility] office this morning because of persistant foul smelling diarrhea with volume depletion.  No vomiting.  She had her G tube removed about 2 weeks ago and was doing well until then set in and has not abated.  She has felt feverish at times and is very weak.    Past Medical History:  Diagnosis Date  . Anemia    h/o of....none at the moment  . Anxiety   . Arthritis   . Depression   . Dysrhythmia    per patient , they said  " i have a fast hear rate"   . Family history of anesthesia complication    father hard to wake up and N/V  . H/O hiatal hernia   . Hypertension   . PONV (postoperative nausea and vomiting)   . Shortness of breath    states its from anxiety & allergies    Past Surgical History:  Procedure Laterality Date  . BACK SURGERY     lumbar  . CHOLECYSTECTOMY    . HERNIA REPAIR    . HIATAL HERNIA REPAIR N/A 01/28/2017   Procedure: LAPAROSCOPIC REPAIR OF LARGE HIATAL HERNIA;  Surgeon: Johnathan Hausen, MD;  Location: [facility];  Service: General;  Laterality: N/A;  . MOHS SURGERY    . 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 TOTAL HIP ARTHROPLASTY Right 10/30/2013   Procedure: RIGHT TOTAL HIP ARTHROPLASTY;  Surgeon: Kerin Salen, MD;  Location: [facility];  Service: Orthopedics;  Laterality: Right;    Current Facility-Administered Medications  Medication Dose Route Frequency Provider Last Rate Last Dose  . dextrose 5 % in lactated ringers infusion   Intravenous Continuous Johnathan Hausen, MD       Watermelon flavor; Claritin-d 12 hour [loratadine-pseudoephedrine er]; Codeine; and Z-pak [azithromycin] No family history on file. Social History:   reports that she has never smoked. She has never used smokeless tobacco. She reports that she drinks alcohol. She reports that she does not use drugs.   REVIEW OF SYSTEMS  : Negative except for weakness  Physical Exam:   Blood pressure 118/74, temperature 98.3 F (36.8 C), resp. rate 16, SpO2 100 %. There is no height or weight on file to calculate BMI.  Gen:  WDWN WF NAD  Neurological: Alert and oriented to person, place, and time. Motor and sensory function is grossly intact  Head: Normocephalic and atraumatic.  Eyes: Conjunctivae are normal. Pupils are equal, round, and reactive to light. No scleral icterus.  Neck: Normal range of motion. Neck supple. No tracheal deviation or thyromegaly present.  Cardiovascular:  Sinus tachycardia with rate 120 without murmurs or gallops.  No carotid bruits Breast:  Not examined Respiratory: Effort normal.  No respiratory distress. No chest wall tenderness. Breath sounds normal.  No wheezes, rales or rhonchi.  Abdomen:  nontender and G tube site has healed GU:  Not examined Musculoskeletal: Normal range of motion. Extremities are nontender. No cyanosis, edema or clubbing noted Lymphadenopathy: No cervical, preauricular, postauricular or axillary adenopathy is present Skin: Skin is warm and dry. No rash noted. No diaphoresis. No erythema. No pallor. Pscyh: Normal mood and affect. Behavior is normal. Judgment and thought content normal.   LABORATORY RESULTS: No results found for this or any previous visit (from the past 48 hour(s)).   RADIOLOGY RESULTS: No results found.  Problem  List: Patient Active Problem List   Diagnosis Date Noted  . Diarrhea with volume depletion 04/07/2017  . History of repair of hiatal hernia with tube gastrostomy August 2018 01/28/2017  . Arthritis of right hip 10/30/2013    Assessment & Plan: Admit for hydration and rule out C dif colitis.      Matt B. Hassell Done, MD, [facility] [phone] beeper [phone]  04/07/2017 1:34 PM

## 2017-04-07 NOTE — Assessment & Plan Note (Signed)
C dif pending

## 2017-04-08 ENCOUNTER — Encounter (HOSPITAL_COMMUNITY): Payer: Self-pay | Admitting: *Deleted

## 2017-04-08 ENCOUNTER — Observation Stay (HOSPITAL_COMMUNITY): Payer: 59

## 2017-04-08 DIAGNOSIS — R197 Diarrhea, unspecified: Secondary | ICD-10-CM | POA: Diagnosis not present

## 2017-04-08 LAB — BASIC METABOLIC PANEL
Anion gap: 11 (ref 5–15)
BUN: 11 mg/dL (ref 6–20)
CALCIUM: 8.6 mg/dL — AB (ref 8.9–10.3)
CO2: 25 mmol/L (ref 22–32)
Chloride: 103 mmol/L (ref 101–111)
Creatinine, Ser: 0.81 mg/dL (ref 0.44–1.00)
GFR calc Af Amer: >60 mL/min (ref >60)
GLUCOSE: 107 mg/dL — AB (ref 65–99)
Potassium: 3.6 mmol/L (ref 3.5–5.1)
SODIUM: 139 mmol/L (ref 135–145)

## 2017-04-08 LAB — HIV ANTIBODY (ROUTINE TESTING W REFLEX): HIV SCREEN 4TH GENERATION: NONREACTIVE

## 2017-04-08 LAB — CBC
HCT: 36.8 % (ref 36.0–46.0)
HEMOGLOBIN: 12 g/dL (ref 12.0–15.0)
MCH: 30 pg (ref 26.0–34.0)
MCHC: 32.6 g/dL (ref 30.0–36.0)
MCV: 92 fL (ref 78.0–100.0)
Platelets: 289 10*3/uL (ref 150–400)
RBC: 4 MIL/uL (ref 3.87–5.11)
RDW: 13.2 % (ref 11.5–15.5)
WBC: 8.7 10*3/uL (ref 4.0–10.5)

## 2017-04-08 LAB — MAGNESIUM: MAGNESIUM: 1.6 mg/dL — AB (ref 1.7–2.4)

## 2017-04-08 MED ORDER — IOPAMIDOL (ISOVUE-300) INJECTION 61%
INTRAVENOUS | Status: AC
  Filled 2017-04-08: qty 30

## 2017-04-08 MED ORDER — IOPAMIDOL (ISOVUE-300) INJECTION 61%
100.0000 mL | Freq: Once | INTRAVENOUS | Status: AC | PRN
  Administered 2017-04-08: 100 mL via INTRAVENOUS

## 2017-04-08 MED ORDER — IOPAMIDOL (ISOVUE-300) INJECTION 61%
30.0000 mL | Freq: Once | INTRAVENOUS | Status: AC | PRN
  Administered 2017-04-08: 30 mL via ORAL

## 2017-04-08 MED ORDER — IOPAMIDOL (ISOVUE-300) INJECTION 61%
INTRAVENOUS | Status: AC
  Filled 2017-04-08: qty 100

## 2017-04-08 NOTE — Progress Notes (Signed)
Dr. Marlou Starks, Eddie Dibbles  Notified of patient low urine output. Patient has had numerous bowel movements of liquid stools. Patient c/o nausea without vomiting.

## 2017-04-08 NOTE — Progress Notes (Signed)
Patient ID: Janice Orr, female   DOB: July 16, 1955, 61 y.o.   MRN: [id number] [facility] Surgery Progress Note:   * No surgery found *  Subjective: Mental status is clear.  She had a bad night with diarrhea and with some nausea and dry heaves.  Her IV was turned down to 50 cc /hr by her nurse on nights and that was clearly against my orders to try and rehydrate her.   Objective: Vital signs in last 24 hours: Temp:  [97.9 F (36.6 C)-98.3 F (36.8 C)] 97.9 F (36.6 C) (10/11 0451) Pulse Rate:  [76-89] 89 (10/11 0451) Resp:  [16] 16 (10/11 0451) BP: (93-118)/(53-74) 102/62 (10/11 0451) SpO2:  [97 %-100 %] 100 % (10/11 0451) Weight:  [82 kg (180 lb 12.8 oz)] 82 kg (180 lb 12.8 oz) (10/11 0500)  Intake/Output from previous day: 10/10 0701 - 10/11 0700 In: 1875 [I.V.:1875] Out: 225 [Urine:225] Intake/Output this shift: No intake/output data recorded.  Physical Exam: Work of breathing is not labored.  No rebound or guarding.  Lab Results:  Results for orders placed or performed during the hospital encounter of 04/07/17 (from the past 48 hour(s))  CBC with Differential/Platelet     Status: Abnormal   Collection Time: 04/07/17  1:17 PM  Result Value Ref Range   WBC 10.3 4.0 - 10.5 K/uL   RBC 4.65 3.87 - 5.11 MIL/uL   Hemoglobin 14.0 12.0 - 15.0 g/dL   HCT 43.3 36.0 - 46.0 %   MCV 93.1 78.0 - 100.0 fL   MCH 30.1 26.0 - 34.0 pg   MCHC 32.3 30.0 - 36.0 g/dL   RDW 13.0 11.5 - 15.5 %   Platelets 352 150 - 400 K/uL   Neutrophils Relative % 66 %   Neutro Abs 6.9 1.7 - 7.7 K/uL   Lymphocytes Relative 18 %   Lymphs Abs 1.8 0.7 - 4.0 K/uL   Monocytes Relative 8 %   Monocytes Absolute 0.8 0.1 - 1.0 K/uL   Eosinophils Relative 8 %   Eosinophils Absolute 0.8 (H) 0.0 - 0.7 K/uL   Basophils Relative 0 %   Basophils Absolute 0.0 0.0 - 0.1 K/uL  Comprehensive metabolic panel     Status: Abnormal   Collection Time: 04/07/17  1:17 PM  Result Value Ref Range   Sodium 141 135 - 145  mmol/L   Potassium 3.3 (L) 3.5 - 5.1 mmol/L   Chloride 103 101 - 111 mmol/L   CO2 23 22 - 32 mmol/L   Glucose, Bld 102 (H) 65 - 99 mg/dL   BUN 13 6 - 20 mg/dL   Creatinine, Ser 0.97 0.44 - 1.00 mg/dL   Calcium 9.2 8.9 - 10.3 mg/dL   Total Protein 8.0 6.5 - 8.1 g/dL   Albumin 4.3 3.5 - 5.0 g/dL   AST 20 15 - 41 U/L   ALT 16 14 - 54 U/L   Alkaline Phosphatase 163 (H) 38 - 126 U/L   Total Bilirubin 0.3 0.3 - 1.2 mg/dL   GFR calc non Af Amer >60 >60 mL/min   GFR calc Af Amer >60 >60 mL/min    Comment: (NOTE) The eGFR has been calculated using the CKD EPI equation. This calculation has not been validated in all clinical situations. eGFR's persistently <60 mL/min signify possible Chronic Kidney Disease.    Anion gap 15 5 - 15  C difficile quick scan w PCR reflex     Status: None   Collection Time: 04/07/17  2:32  PM  Result Value Ref Range   C Diff antigen NEGATIVE NEGATIVE   C Diff toxin NEGATIVE NEGATIVE   C Diff interpretation No C. difficile detected.   HIV antibody (Routine Testing)     Status: None   Collection Time: 04/07/17  2:34 PM  Result Value Ref Range   HIV Screen 4th Generation wRfx Non Reactive Non Reactive    Comment: (NOTE) Performed At: [facility] [place], [place] [id number] Lindon Romp MD [id number]   Magnesium     Status: Abnormal   Collection Time: 04/08/17  5:15 AM  Result Value Ref Range   Magnesium 1.6 (L) 1.7 - 2.4 mg/dL  Basic metabolic panel     Status: Abnormal   Collection Time: 04/08/17  5:15 AM  Result Value Ref Range   Sodium 139 135 - 145 mmol/L   Potassium 3.6 3.5 - 5.1 mmol/L   Chloride 103 101 - 111 mmol/L   CO2 25 22 - 32 mmol/L   Glucose, Bld 107 (H) 65 - 99 mg/dL   BUN 11 6 - 20 mg/dL   Creatinine, Ser 0.81 0.44 - 1.00 mg/dL   Calcium 8.6 (L) 8.9 - 10.3 mg/dL   GFR calc non Af Amer >60 >60 mL/min   GFR calc Af Amer >60 >60 mL/min    Comment: (NOTE) The eGFR has been calculated using the CKD EPI  equation. This calculation has not been validated in all clinical situations. eGFR's persistently <60 mL/min signify possible Chronic Kidney Disease.    Anion gap 11 5 - 15  CBC     Status: None   Collection Time: 04/08/17  5:15 AM  Result Value Ref Range   WBC 8.7 4.0 - 10.5 K/uL   RBC 4.00 3.87 - 5.11 MIL/uL   Hemoglobin 12.0 12.0 - 15.0 g/dL   HCT 36.8 36.0 - 46.0 %   MCV 92.0 78.0 - 100.0 fL   MCH 30.0 26.0 - 34.0 pg   MCHC 32.6 30.0 - 36.0 g/dL   RDW 13.2 11.5 - 15.5 %   Platelets 289 150 - 400 K/uL    Radiology/Results: No results found.  Anti-infectives: Anti-infectives    None      Assessment/Plan: Problem List: Patient Active Problem List   Diagnosis Date Noted  . Diarrhea with volume depletion 04/07/2017  . Volume depletion 04/07/2017  . History of repair of hiatal hernia with tube gastrostomy August 2018 01/28/2017  . Arthritis of right hip 10/30/2013    Symptoms persists;  C dif was negative; IV rehydration was interrupted by nursing without apparent reason.  Will get CT scan to access for complications of G tube  * No surgery found *    LOS: 0 days   Matt B. Hassell Done, MD, [facility] [phone] beeper [phone]  04/08/2017 10:35 AM

## 2017-04-09 DIAGNOSIS — A09 Infectious gastroenteritis and colitis, unspecified: Secondary | ICD-10-CM | POA: Diagnosis not present

## 2017-04-09 DIAGNOSIS — Z7951 Long term (current) use of inhaled steroids: Secondary | ICD-10-CM | POA: Diagnosis not present

## 2017-04-09 DIAGNOSIS — Z23 Encounter for immunization: Secondary | ICD-10-CM | POA: Diagnosis not present

## 2017-04-09 DIAGNOSIS — Z96641 Presence of right artificial hip joint: Secondary | ICD-10-CM | POA: Diagnosis present

## 2017-04-09 DIAGNOSIS — Z79899 Other long term (current) drug therapy: Secondary | ICD-10-CM | POA: Diagnosis not present

## 2017-04-09 DIAGNOSIS — K5730 Diverticulosis of large intestine without perforation or abscess without bleeding: Secondary | ICD-10-CM | POA: Diagnosis present

## 2017-04-09 DIAGNOSIS — I10 Essential (primary) hypertension: Secondary | ICD-10-CM | POA: Diagnosis present

## 2017-04-09 DIAGNOSIS — K6389 Other specified diseases of intestine: Secondary | ICD-10-CM | POA: Diagnosis not present

## 2017-04-09 DIAGNOSIS — K52839 Microscopic colitis, unspecified: Secondary | ICD-10-CM | POA: Diagnosis not present

## 2017-04-09 DIAGNOSIS — R197 Diarrhea, unspecified: Secondary | ICD-10-CM | POA: Diagnosis not present

## 2017-04-09 DIAGNOSIS — E860 Dehydration: Secondary | ICD-10-CM | POA: Diagnosis not present

## 2017-04-09 LAB — GASTROINTESTINAL PANEL BY PCR, STOOL (REPLACES STOOL CULTURE)
ADENOVIRUS F40/41: NOT DETECTED
ASTROVIRUS: NOT DETECTED
CYCLOSPORA CAYETANENSIS: NOT DETECTED
Campylobacter species: NOT DETECTED
Cryptosporidium: NOT DETECTED
ENTAMOEBA HISTOLYTICA: NOT DETECTED
ENTEROTOXIGENIC E COLI (ETEC): NOT DETECTED
Enteroaggregative E coli (EAEC): NOT DETECTED
Enteropathogenic E coli (EPEC): NOT DETECTED
GIARDIA LAMBLIA: NOT DETECTED
Norovirus GI/GII: NOT DETECTED
Plesimonas shigelloides: NOT DETECTED
Rotavirus A: NOT DETECTED
SAPOVIRUS (I, II, IV, AND V): NOT DETECTED
Salmonella species: NOT DETECTED
Shiga like toxin producing E coli (STEC): NOT DETECTED
Shigella/Enteroinvasive E coli (EIEC): NOT DETECTED
VIBRIO CHOLERAE: NOT DETECTED
VIBRIO SPECIES: NOT DETECTED
Yersinia enterocolitica: NOT DETECTED

## 2017-04-09 MED ORDER — BACID PO TABS
2.0000 | ORAL_TABLET | Freq: Three times a day (TID) | ORAL | Status: DC
  Filled 2017-04-09: qty 2

## 2017-04-09 MED ORDER — FLORANEX PO PACK
1.0000 g | PACK | Freq: Three times a day (TID) | ORAL | Status: DC
  Administered 2017-04-09 – 2017-04-14 (×14): 1 g via ORAL
  Filled 2017-04-09 (×17): qty 1

## 2017-04-09 MED ORDER — COLESTIPOL HCL 1 G PO TABS
2.0000 g | ORAL_TABLET | Freq: Two times a day (BID) | ORAL | Status: DC
  Administered 2017-04-09 – 2017-04-14 (×11): 2 g via ORAL
  Filled 2017-04-09 (×11): qty 2

## 2017-04-09 NOTE — Consult Note (Signed)
[facility] Consult  Referring Provider: Johnathan Hausen, MD Primary Care Physician:  Kathyrn Lass, MD Primary Gastroenterologist: Dr.Magod  Reason for Consultation:  diarrhea  HPI: Janice Orr is a 61 y.o. Caucasian female admitted on 04/07/17 with diarrhea. Patient underwent laparoscopic hiatal hernia repair on 01/28/2017 and had a gastrostomy tube in place for 7 weeks( did not use it except for drainage), was removed 2 weeks ago. She was in her usual state of health until 6 days ago when she developed loose watery stools. For the first few days she noticed increasing frequency from her normal of 2 times a day to 8-10 times a day.For the last 2 days it has worsened,she currently reports a bowel movement every 30 minute, describes it as watery,foul-smelling, not associated with nausea or vomiting, mild abdominal discomfort, no fever, chills or rigors. She has not noted any blood in stool or black stools.She also complains of nocturnal diarrhea Last colonoscopy from 2016 was unremarkable except diverticulosis. Colonoscopy from 2011 unremarkable except diverticulosis. Colonoscopy from 2008 showed a serrated sessile adenoma.  Patient states she has lost over 15 pounds since his surgery,she attributes this to loss of appetite and also trying to avoid food post surgery. Normally, patient would have 2 bowel movements a day and did not have loose watery stools.   Past Medical History:  Diagnosis Date  . Anemia    h/o of....none at the moment  . Anxiety   . Arthritis   . Depression   . Dysrhythmia    per patient , they said  " i have a fast hear rate"   . Family history of anesthesia complication    father hard to wake up and N/V  . H/O hiatal hernia   . Hypertension   . PONV (postoperative nausea and vomiting)   . Shortness of breath    states its from anxiety & allergies    Past Surgical History:  Procedure Laterality Date  . BACK SURGERY     lumbar  .  CHOLECYSTECTOMY    . HERNIA REPAIR    . HIATAL HERNIA REPAIR N/A 01/28/2017   Procedure: LAPAROSCOPIC REPAIR OF LARGE HIATAL HERNIA;  Surgeon: Johnathan Hausen, MD;  Location: [facility];  Service: General;  Laterality: N/A;  . MOHS SURGERY    . 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 TOTAL HIP ARTHROPLASTY Right 10/30/2013   Procedure: RIGHT TOTAL HIP ARTHROPLASTY;  Surgeon: Kerin Salen, MD;  Location: [facility];  Service: Orthopedics;  Laterality: Right;    Prior to Admission medications   Medication Sig Start Date End Date Taking? Authorizing Provider  dexlansoprazole (DEXILANT) 60 MG capsule Take 60 mg by mouth daily.   Yes [provider]  fluticasone (FLONASE) 50 MCG/ACT nasal spray 1 spray each nares bid Patient taking differently: Place 1 spray into both nostrils 2 (two) times daily as needed for allergies.  12/13/13  Yes Tanna Furry, MD  ibuprofen (ADVIL,MOTRIN) 200 MG tablet Take 600-800 mg by mouth every 6 (six) hours as needed for headache or mild pain.   Yes [provider]  liver oil-zinc oxide (DESITIN) 40 % ointment Apply 1 application topically 2 (two) times daily as needed (on wound infection).   Yes [provider]  loratadine (CLARITIN) 10 MG tablet Take 10 mg by mouth daily. 12/21/16  Yes [provider]  LORazepam (ATIVAN) 0.5 MG tablet Take 0.25-0.5 mg by mouth every 8 (eight) hours as needed for anxiety.  Yes [provider]  metoprolol succinate (TOPROL-XL) 25 MG 24 hr tablet Take 25 mg by mouth daily.   Yes [provider]  ondansetron (ZOFRAN-ODT) 4 MG disintegrating tablet Take 1 tablet (4 mg total) by mouth every 6 (six) hours as needed for nausea. 01/31/17  Yes Kinsinger, Arta Bruce, MD  promethazine (PHENERGAN) 25 MG tablet Take 25 mg by mouth every 8 (eight) hours as needed for nausea or vomiting.   Yes [provider]  simvastatin (ZOCOR) 20 MG tablet Take 20 mg by mouth daily.   Yes [provider]  traMADol (ULTRAM) 50 MG tablet Take 50 mg by mouth every 6 (six) hours as needed for severe pain.   Yes [provider]  triamcinolone cream (KENALOG) 0.1 % Apply 1 application topically 2 (two) times daily as needed (for skin irritation or rash).  11/12/16  Yes [provider]  venlafaxine XR (EFFEXOR-XR) 150 MG 24 hr capsule Take 150 mg by mouth daily. 11/24/16  Yes [provider]  zolpidem (AMBIEN) 10 MG tablet Take 10 mg by mouth at bedtime.    Yes [provider]  HYDROcodone-acetaminophen (NORCO/VICODIN) 5-325 MG tablet Take 1 tablet by mouth every 4 (four) hours as needed for moderate pain. Patient not taking: Reported on 04/07/2017 01/31/17   Kinsinger, Arta Bruce, MD    Current Facility-Administered Medications  Medication Dose Route Frequency Provider Last Rate Last Dose  . colestipol (COLESTID) tablet 2 g  2 g Oral BID Ronnette Juniper, MD   2 g at 04/09/17 1356  . dextrose 5 % in lactated ringers infusion   Intravenous Continuous Johnathan Hausen, MD 125 mL/hr at 04/09/17 [phone]    . fluticasone (FLONASE) 50 MCG/ACT nasal spray 1 spray  1 spray Each Nare BID PRN Johnathan Hausen, MD      . HYDROcodone-acetaminophen (NORCO/VICODIN) 5-325 MG per tablet 1 tablet  1 tablet Oral Q4H PRN Johnathan Hausen, MD   1 tablet at 04/07/17 2012  . LORazepam (ATIVAN) tablet 0.25-0.5 mg  0.25-0.5 mg Oral Q8H PRN Johnathan Hausen, MD   0.5 mg at 04/08/17 2210  . metoprolol succinate (TOPROL-XL) 24 hr tablet 25 mg  25 mg Oral Daily Johnathan Hausen, MD   25 mg at 04/09/17 0959  . ondansetron (ZOFRAN-ODT) disintegrating tablet 4 mg  4 mg Oral Q6H PRN Johnathan Hausen, MD   4 mg at 04/08/17 1847  . pantoprazole (PROTONIX) EC tablet 40 mg  40 mg Oral Daily Johnathan Hausen, MD   40 mg at 04/09/17 0958  . promethazine (PHENERGAN) tablet 25 mg  25 mg Oral Q8H PRN Johnathan Hausen, MD   25 mg at 04/07/17 2011  . simvastatin (ZOCOR) tablet 20 mg  20 mg Oral Daily Johnathan Hausen, MD   20 mg at 04/09/17 0958  . venlafaxine XR (EFFEXOR-XR) 24 hr capsule 150 mg  150 mg Oral Daily Johnathan Hausen, MD   150 mg at 04/09/17 0958  . zolpidem (AMBIEN) tablet 5 mg  5 mg Oral QHS Johnathan Hausen, MD   5 mg at 04/07/17 2340    Allergies as of 04/07/2017 - Review Complete 04/07/2017  Allergen Reaction Noted  . Watermelon flavor Anaphylaxis and Hives 01/08/2017  . Claritin-d 12 hour [loratadine-pseudoephedrine er] Other (See Comments) 12/12/2013  . Codeine Hives 10/26/2013  . Z-pak [azithromycin] Hives and Diarrhea 01/07/2017    History reviewed. No pertinent family history.  Social History   Social History  . Marital status: Married    Spouse  name: N/A  . Number of children: N/A  . Years of education: N/A   Occupational History  . Not on file.   Social History Main Topics  . Smoking status: Never Smoker  . Smokeless tobacco: Never Used  . Alcohol use Yes     Comment: occasional  . Drug use: No  . Sexual activity: Not on file   Other Topics Concern  . Not on file   Social History Narrative  . No narrative on file    Review of Systems: Positive for: GI: Described in detail in HPI.    Gen: anorexia, fatigue, weakness,involuntary weight loss,Denies any fever, chills, rigors, night sweats,  malaise,  and sleep disorder CV: Denies chest pain, angina, palpitations, syncope, orthopnea, PND, peripheral edema, and claudication. Resp: Denies dyspnea, cough, sputum, wheezing, coughing up blood. GU : Denies urinary burning, blood in urine, urinary frequency, urinary hesitancy, nocturnal urination, and urinary incontinence. MS: Denies joint pain or swelling.  Denies muscle weakness, cramps, atrophy.  Derm: Denies rash, itching, oral ulcerations, hives, unhealing ulcers.  Psych:depression, anxiety,Denies  memory loss, suicidal ideation, hallucinations,  and confusion. Heme: Denies bruising, bleeding, and enlarged lymph nodes. Neuro:  Denies any headaches,  dizziness, paresthesias. Endo:  Denies any problems with DM, thyroid, adrenal function.  Physical Exam: Vital signs in last 24 hours: Temp:  [98 F (36.7 C)-98.2 F (36.8 C)] 98.2 F (36.8 C) (10/12 0627) Pulse Rate:  [82-99] 82 (10/12 0958) Resp:  [18] 18 (10/12 0627) BP: (105-131)/(65-83) 124/67 (10/12 0958) SpO2:  [97 %-100 %] 100 % (10/12 0627) Last BM Date: 04/08/17  General:   Alert,  Well-developed, well-nourished, pleasant and cooperative in NAD Head:  Normocephalic and atraumatic. Eyes:  Sclera clear, no icterus.   Conjunctiva pink. Ears:  Normal auditory acuity. Nose:  No deformity, discharge,  or lesions. Mouth:  No deformity or lesions.  Oropharynx pink & moist. Neck:  Supple; no masses or thyromegaly. Lungs:  Clear throughout to auscultation.   No wheezes, crackles, or rhonchi. No acute distress. Heart:  Regular rate and rhythm; no murmurs, clicks, rubs,  or gallops. Extremities:  Without clubbing or edema. Neurologic:  Alert and  oriented x4;  grossly normal neurologically. Was tearful during exam, appears anxious and nervous Skin:  Intact without significant lesions or rashes. Psych:  Alert and cooperative. Normal mood and affect. Abdomen:  Soft, nontender and nondistended. No masses, hepatosplenomegaly or hernias noted. Normal bowel sounds, without guarding, and without rebound.         Lab Results:  Recent Labs  04/07/17 1317 04/08/17 0515  WBC 10.3 8.7  HGB 14.0 12.0  HCT 43.3 36.8  PLT 352 289   BMET  Recent Labs  04/07/17 1317 04/08/17 0515  NA 141 139  K 3.3* 3.6  CL 103 103  CO2 23 25  GLUCOSE 102* 107*  BUN 13 11  CREATININE 0.97 0.81  CALCIUM 9.2 8.6*   LFT  Recent Labs  04/07/17 1317  PROT 8.0  ALBUMIN 4.3  AST 20  ALT 16  ALKPHOS 163*  BILITOT 0.3   PT/INR No results for input(s): LABPROT, INR in the last 72 hours.  Studies/Results: Ct Abdomen Pelvis W Contrast  Result Date: 04/08/2017 CLINICAL DATA:  Diarrhea,  abdominal wall hernia. EXAM: CT ABDOMEN AND PELVIS WITH CONTRAST TECHNIQUE: Multidetector CT imaging of the abdomen and pelvis was performed using the standard protocol following bolus administration of intravenous contrast. CONTRAST:  160mL ISOVUE-300 IOPAMIDOL (ISOVUE-300) INJECTION 61% COMPARISON:  None  available currently. FINDINGS: Lower chest: Visualized lung bases are unremarkable. Large sliding-type hiatal hernia is noted. Hepatobiliary: No focal liver abnormality is seen. Status post cholecystectomy. No biliary dilatation. Pancreas: Unremarkable. No pancreatic ductal dilatation or surrounding inflammatory changes. Spleen: Normal in size without focal abnormality. Adrenals/Urinary Tract: Adrenal glands are unremarkable. Kidneys are normal, without renal calculi, focal lesion, or hydronephrosis. Bladder is unremarkable. Stomach/Bowel: Stomach is within normal limits. Appendix appears normal. No evidence of bowel wall thickening, distention, or inflammatory changes. Sigmoid diverticulosis is noted without inflammation. Vascular/Lymphatic: No significant vascular findings are present. No enlarged abdominal or pelvic lymph nodes. Reproductive: Uterus and bilateral adnexa are unremarkable. Other: No abdominal wall hernia or abnormality. No abdominopelvic ascites. Musculoskeletal: No acute or significant osseous findings. IMPRESSION: Large sliding-type hiatal hernia. Sigmoid diverticulosis without inflammation. No acute abnormality seen in the abdomen or pelvis. Electronically Signed   By: Marijo Conception, M.D.   On: 04/08/2017 14:55    Impression: 1.Diarrhea-C. Difficile negative, CAT scan unremarkable, no acidosis 2.Recent hiatal hernia surgery 3.Gastrostomy tube, removed 2 weeks ago  Plan: 1.Rule out infectious etiology, GI pathogen has been sent, results pending 2.?Multifactorial-post cholecystectomy state, IBS, possible infection  No leukocytosis, normal renal function, normal bicarbonate  reassuring. We will start Colestid 2 g twice a day,it should decrease frequency and increase consistency of stool more solid or formed, may adjust dose accordingly. We'll start probiotics. We will stop Protonix as it may worsen diarrhea. Continue IV fluids at 125 mL per hour, monitor electrolytes and replace accordingly.   LOS: 0 days   Ronnette Juniper, M.D.   04/09/2017, 2:17 PM  Pager [phone] If no answer or after 5 PM call [phone]

## 2017-04-09 NOTE — Progress Notes (Signed)
Patient ID: Janice Orr, female   DOB: Apr 19, 1956, 61 y.o.   MRN: [id number] Diarrhea is now in its 5th day.  IVs kept at 125 cc/hr.  C dif negative;  CT scan negative.    Have ordered stool screen by pcr.  Will ask GI for help.  Kaylyn Lim, MD, FACS

## 2017-04-10 MED ORDER — LOPERAMIDE HCL 2 MG PO CAPS
2.0000 mg | ORAL_CAPSULE | Freq: Three times a day (TID) | ORAL | Status: DC | PRN
  Administered 2017-04-10: 2 mg via ORAL
  Administered 2017-04-10: 4 mg via ORAL
  Administered 2017-04-10: 2 mg via ORAL
  Filled 2017-04-10: qty 1
  Filled 2017-04-10: qty 2
  Filled 2017-04-10 (×2): qty 1

## 2017-04-10 MED ORDER — ENSURE ENLIVE PO LIQD
237.0000 mL | Freq: Two times a day (BID) | ORAL | Status: DC
  Administered 2017-04-10 – 2017-04-14 (×9): 237 mL via ORAL

## 2017-04-10 MED ORDER — BACID PO TABS: 2.0000 | ORAL_TABLET | Freq: Three times a day (TID) | ORAL | Status: DC

## 2017-04-10 MED ORDER — LORATADINE 10 MG PO TABS
10.0000 mg | ORAL_TABLET | Freq: Every day | ORAL | Status: DC
  Administered 2017-04-10 – 2017-04-14 (×5): 10 mg via ORAL
  Filled 2017-04-10 (×5): qty 1

## 2017-04-10 NOTE — Progress Notes (Signed)
Diarrhea  Subjective: Somewhat better today.  Still having multiple loose stools  Objective: Vital signs in last 24 hours: Temp:  [97.9 F (36.6 C)-99 F (37.2 C)] 99 F (37.2 C) (10/13 0640) Pulse Rate:  [81-86] 86 (10/13 0640) Resp:  [16-18] 16 (10/13 0640) BP: (107-135)/(63-71) 107/63 (10/13 0640) SpO2:  [98 %-100 %] 99 % (10/13 0640) Last BM Date: 04/10/17  Intake/Output from previous day: 10/12 0701 - 10/13 0700 In: 2090 [P.O.:1090; I.V.:1000] Out: 200 [Urine:200] Intake/Output this shift: No intake/output data recorded.  General appearance: alert and cooperative GI: soft  Lab Results:  Results for orders placed or performed during the hospital encounter of 04/07/17 (from the past 24 hour(s))  Gastrointestinal Panel by PCR , Stool     Status: None   Collection Time: 04/09/17 10:50 AM  Result Value Ref Range   Campylobacter species NOT DETECTED NOT DETECTED   Plesimonas shigelloides NOT DETECTED NOT DETECTED   Salmonella species NOT DETECTED NOT DETECTED   Yersinia enterocolitica NOT DETECTED NOT DETECTED   Vibrio species NOT DETECTED NOT DETECTED   Vibrio cholerae NOT DETECTED NOT DETECTED   Enteroaggregative E coli (EAEC) NOT DETECTED NOT DETECTED   Enteropathogenic E coli (EPEC) NOT DETECTED NOT DETECTED   Enterotoxigenic E coli (ETEC) NOT DETECTED NOT DETECTED   Shiga like toxin producing E coli (STEC) NOT DETECTED NOT DETECTED   Shigella/Enteroinvasive E coli (EIEC) NOT DETECTED NOT DETECTED   Cryptosporidium NOT DETECTED NOT DETECTED   Cyclospora cayetanensis NOT DETECTED NOT DETECTED   Entamoeba histolytica NOT DETECTED NOT DETECTED   Giardia lamblia NOT DETECTED NOT DETECTED   Adenovirus F40/41 NOT DETECTED NOT DETECTED   Astrovirus NOT DETECTED NOT DETECTED   Norovirus GI/GII NOT DETECTED NOT DETECTED   Rotavirus A NOT DETECTED NOT DETECTED   Sapovirus (I, II, IV, and V) NOT DETECTED NOT DETECTED     Studies/Results Radiology     MEDS,  Scheduled . colestipol  2 g Oral BID  . feeding supplement (ENSURE ENLIVE)  237 mL Oral BID BM  . lactobacillus  1 g Oral TID WC  . lactobacillus acidophilus  2 tablet Oral TID  . loratadine  10 mg Oral Daily  . metoprolol succinate  25 mg Oral Daily  . simvastatin  20 mg Oral Daily  . venlafaxine XR  150 mg Oral Daily  . zolpidem  5 mg Oral QHS     Assessment: Diarrhea   Plan: Cont cholestyramine Add imodium PRN Add probiotic   LOS: 1 day    Rosario Adie, MD [facility], [place]   04/10/2017 8:42 AM

## 2017-04-10 NOTE — Progress Notes (Signed)
[facility] PROGRESS NOTE Subjective Diarrhea some better, C diff and GI path panel negative  Objective: Vital signs in last 24 hours: Temp:  [97.9 F (36.6 C)-99 F (37.2 C)] 99 F (37.2 C) (10/13 0640) Pulse Rate:  [81-86] 86 (10/13 0640) Resp:  [16-18] 16 (10/13 0640) BP: (107-135)/(63-71) 107/63 (10/13 0640) SpO2:  [98 %-100 %] 99 % (10/13 0640) Last BM Date: 04/10/17  Intake/Output from previous day: 10/12 0701 - 10/13 0700 In: 2090 [P.O.:1090; I.V.:1000] Out: 200 [Urine:200] Intake/Output this shift: No intake/output data recorded.  PE: General--alert walking around the room  Abdomen--nontender  Lab Results:  Recent Labs  04/07/17 1317 04/08/17 0515  WBC 10.3 8.7  HGB 14.0 12.0  HCT 43.3 36.8  PLT 352 289   BMET  Recent Labs  04/07/17 1317 04/08/17 0515  NA 141 139  K 3.3* 3.6  CL 103 103  CO2 23 25  CREATININE 0.97 0.81   LFT  Recent Labs  04/07/17 1317  PROT 8.0  AST 20  ALT 16  ALKPHOS 163*  BILITOT 0.3   PT/INR No results for input(s): LABPROT, INR in the last 72 hours. PANCREAS No results for input(s): LIPASE in the last 72 hours.       Studies/Results: Ct Abdomen Pelvis W Contrast  Result Date: 04/08/2017 CLINICAL DATA:  Diarrhea, abdominal wall hernia. EXAM: CT ABDOMEN AND PELVIS WITH CONTRAST TECHNIQUE: Multidetector CT imaging of the abdomen and pelvis was performed using the standard protocol following bolus administration of intravenous contrast. CONTRAST:  110mL ISOVUE-300 IOPAMIDOL (ISOVUE-300) INJECTION 61% COMPARISON:  None available currently. FINDINGS: Lower chest: Visualized lung bases are unremarkable. Large sliding-type hiatal hernia is noted. Hepatobiliary: No focal liver abnormality is seen. Status post cholecystectomy. No biliary dilatation. Pancreas: Unremarkable. No pancreatic ductal dilatation or surrounding inflammatory changes. Spleen: Normal in size without focal abnormality. Adrenals/Urinary  Tract: Adrenal glands are unremarkable. Kidneys are normal, without renal calculi, focal lesion, or hydronephrosis. Bladder is unremarkable. Stomach/Bowel: Stomach is within normal limits. Appendix appears normal. No evidence of bowel wall thickening, distention, or inflammatory changes. Sigmoid diverticulosis is noted without inflammation. Vascular/Lymphatic: No significant vascular findings are present. No enlarged abdominal or pelvic lymph nodes. Reproductive: Uterus and bilateral adnexa are unremarkable. Other: No abdominal wall hernia or abnormality. No abdominopelvic ascites. Musculoskeletal: No acute or significant osseous findings. IMPRESSION: Large sliding-type hiatal hernia. Sigmoid diverticulosis without inflammation. No acute abnormality seen in the abdomen or pelvis. Electronically Signed   By: Marijo Conception, M.D.   On: 04/08/2017 14:55    Medications: I have reviewed the patient's current medications.  Assessment:   1. Diarrhea. Infectious causes ruled out. Pt notes that things are better. Still multiple loose watery stools   Plan: Continue colestid, probiotics. If not better in a few days ? Sigmoid with colonic biopsies.   Shaletha Humble JR,Kanitra Purifoy L 04/10/2017, 8:48 AM  This note was created using voice recognition software. Minor errors may Have occurred unintentionally.  Pager: [phone] If no answer or after hours call [phone]

## 2017-04-11 LAB — TSH: TSH: 3.524 u[IU]/mL (ref 0.350–4.500)

## 2017-04-11 MED ORDER — PANTOPRAZOLE SODIUM 20 MG PO TBEC
20.0000 mg | DELAYED_RELEASE_TABLET | Freq: Every day | ORAL | Status: DC
  Administered 2017-04-11 – 2017-04-14 (×4): 20 mg via ORAL
  Filled 2017-04-11 (×4): qty 1

## 2017-04-11 MED ORDER — SODIUM CHLORIDE 0.9% FLUSH
3.0000 mL | Freq: Two times a day (BID) | INTRAVENOUS | Status: DC
  Administered 2017-04-11 – 2017-04-13 (×4): 3 mL via INTRAVENOUS

## 2017-04-11 MED ORDER — LACTATED RINGERS IV BOLUS (SEPSIS)
1000.0000 mL | Freq: Every day | INTRAVENOUS | Status: DC
  Administered 2017-04-12 – 2017-04-14 (×3): 1000 mL via INTRAVENOUS

## 2017-04-11 MED ORDER — SODIUM CHLORIDE 0.9% FLUSH: 3.0000 mL | INTRAVENOUS | Status: DC | PRN

## 2017-04-11 MED ORDER — DIPHENOXYLATE-ATROPINE 2.5-0.025 MG PO TABS
1.0000 | ORAL_TABLET | Freq: Four times a day (QID) | ORAL | Status: DC
Start: 2017-04-11 — End: 2017-04-14
  Administered 2017-04-11 – 2017-04-14 (×13): 1 via ORAL
  Filled 2017-04-11 (×13): qty 1

## 2017-04-11 NOTE — Progress Notes (Signed)
Patient c/o lightheadedness. Vital signs stable.  C/o nausea and vomited 50 ml of coffee ground emesis.  LR scheduled to be given at 10 AM. Dr. Leighton Ruff notified for evaluation. Received order.

## 2017-04-11 NOTE — Progress Notes (Addendum)
Diarrhea  Subjective: No change yesterday with adding lomotil.  Still having multiple loose stools.  Hard for her to get to bathroom quickly with IV pole  Objective: Vital signs in last 24 hours: Temp:  [97.9 F (36.6 C)-98.4 F (36.9 C)] 98.4 F (36.9 C) (10/14 0542) Pulse Rate:  [71-73] 71 (10/14 0542) Resp:  [16] 16 (10/14 0542) BP: (113-132)/(55-72) 113/55 (10/14 0542) SpO2:  [100 %] 100 % (10/14 0542) Last BM Date: 04/10/17  Intake/Output from previous day: 10/13 0701 - 10/14 0700 In: 3175.4 [P.O.:1140; I.V.:2035.4] Out: -  Intake/Output this shift: No intake/output data recorded.  General appearance: alert and cooperative GI: soft  Lab Results:  No results found for this or any previous visit (from the past 24 hour(s)).   Studies/Results Radiology     MEDS, Scheduled . colestipol  2 g Oral BID  . diphenoxylate-atropine  1 tablet Oral QID  . feeding supplement (ENSURE ENLIVE)  237 mL Oral BID BM  . lactobacillus  1 g Oral TID WC  . loratadine  10 mg Oral Daily  . metoprolol succinate  25 mg Oral Daily  . simvastatin  20 mg Oral Daily  . sodium chloride flush  3 mL Intravenous Q12H  . venlafaxine XR  150 mg Oral Daily  . zolpidem  5 mg Oral QHS     Assessment: Diarrhea   Plan: Cont cholestyramine Add scheduled Lomotil Will switch to SL IV with daily bolus to make up GI losses Cont probiotic Will await further GI recs Will check for gluten allergy or thyroid abnormality     LOS: 2 days    Rosario Adie, MD [facility], [place]   04/11/2017 8:33 AM

## 2017-04-11 NOTE — Progress Notes (Signed)
Janice Orr 2:40 PM  Subjective: Patient well-known to me from previous GI workup and colonoscopy last one being November 2016 who has had increased diarrhea for one week and her hospital computer chart was reviewed and her case discussed with my partner Dr. Oletta Lamas and she has not had any pain bleeding or fever or any vomiting and stool studies and CT scan was fine and she is happy to have her PEG tube out from her previous surgery and she has no other complaints  Objective: Vital signs stable afebrile no acute distress abdomen is soft nontender good bowel sounds no new labs today  Assessment: Diarrhea questionable etiology  Plan: Will proceed with a flexible sigmoidoscopy tomorrow at noon and will put her on just clear liquids for that procedurebut nothing after midnight except pills with sips of water with further workup and plans pending those findings  [facility]  Pager [phone] After 5PM or if no answer call [phone]

## 2017-04-12 ENCOUNTER — Inpatient Hospital Stay (HOSPITAL_COMMUNITY): Payer: 59 | Admitting: Anesthesiology

## 2017-04-12 ENCOUNTER — Encounter (HOSPITAL_COMMUNITY)
Admission: AD | Disposition: A | Discharge status: Home / Self Care | Payer: Self-pay | Source: Ambulatory Visit | Attending: Surgery

## 2017-04-12 ENCOUNTER — Encounter (HOSPITAL_COMMUNITY): Payer: Self-pay | Admitting: Certified Registered"

## 2017-04-12 HISTORY — PX: COLONOSCOPY: SHX5424

## 2017-04-12 LAB — GLIA (IGA/G) + TTG IGA
ANTIGLIADIN ABS, IGA: 2 U (ref 0–19)
Gliadin IgG: 2 units (ref 0–19)
Tissue Transglutaminase Ab, IgA: <2 U/mL (ref 0–3)

## 2017-04-12 SURGERY — COLONOSCOPY
Anesthesia: Monitor Anesthesia Care

## 2017-04-12 MED ORDER — LIDOCAINE 2% (20 MG/ML) 5 ML SYRINGE
INTRAMUSCULAR | Status: AC
  Filled 2017-04-12: qty 5

## 2017-04-12 MED ORDER — PROPOFOL 10 MG/ML IV BOLUS
INTRAVENOUS | Status: DC | PRN
  Administered 2017-04-12: 20 mg via INTRAVENOUS
  Administered 2017-04-12: 30 mg via INTRAVENOUS
  Administered 2017-04-12 (×3): 20 mg via INTRAVENOUS
  Administered 2017-04-12: 50 mg via INTRAVENOUS
  Administered 2017-04-12: 20 mg via INTRAVENOUS

## 2017-04-12 MED ORDER — LACTATED RINGERS IV SOLN
INTRAVENOUS | Status: DC
Start: 2017-04-12 — End: 2017-04-12
  Administered 2017-04-12: 1000 mL via INTRAVENOUS

## 2017-04-12 MED ORDER — SODIUM CHLORIDE 0.9 % IV SOLN: INTRAVENOUS | Status: DC

## 2017-04-12 MED ORDER — LIDOCAINE 2% (20 MG/ML) 5 ML SYRINGE
INTRAMUSCULAR | Status: DC | PRN
  Administered 2017-04-12: 50 mg via INTRAVENOUS

## 2017-04-12 MED ORDER — PROPOFOL 10 MG/ML IV BOLUS
INTRAVENOUS | Status: AC
  Filled 2017-04-12: qty 40

## 2017-04-12 NOTE — Op Note (Signed)
[facility] Patient Name: Janice Orr Procedure Date: 04/12/2017 MRN: [id number] Attending MD: Clarene Essex , MD Date of Birth: 1955-10-31 CSN: [id number] Age: 61 Admit Type: Inpatient Procedure:                Colonoscopy Indications:              Last colonoscopy: 2016, Clinically significant                            diarrhea of unexplained origin Providers:                Clarene Essex, MD, Carolynn Comment RN, RN, Janie                            Billups, Technician, [facility], CRNA Referring MD:              Medicines:                Propofol total dose 200 mg IV, 50 mg IV lidocaine Complications:            No immediate complications. Estimated Blood Loss:     Estimated blood loss: none. Procedure:                Pre-Anesthesia Assessment:                           - Prior to the procedure, a History and Physical                            was performed, and patient medications and                            allergies were reviewed. The patient's tolerance of                            previous anesthesia was also reviewed. The risks                            and benefits of the procedure and the sedation                            options and risks were discussed with the patient.                            All questions were answered, and informed consent                            was obtained. Prior Anticoagulants: The patient has                            taken no previous anticoagulant or antiplatelet                            agents. ASA Grade Assessment: II - A patient with  mild systemic disease. After reviewing the risks                            and benefits, the patient was deemed in                            satisfactory condition to undergo the procedure.                           After obtaining informed consent, the colonoscope                            was passed under direct vision. Throughout the                             procedure, the patient's blood pressure, pulse, and                            oxygen saturations were monitored continuously. The                            EC-[id number] ([id number]) scope was introduced through                            the anus and advanced to the the terminal ileum.                            After obtaining informed consent, the colonoscope                            was passed under direct vision. Throughout the                            procedure, the patient's blood pressure, pulse, and                            oxygen saturations were monitored continuously. The                            terminal ileum, ileocecal valve, appendiceal                            orifice, and rectum were photographed. The                            colonoscopy was performed without difficulty. The                            patient tolerated the procedure well. The quality                            of the bowel preparation was adequate. abdominal  pressure was applied Scope In: 12:04:07 PM Scope Out: 12:25:03 PM Scope Withdrawal Time: 0 hours 14 minutes 18 seconds  Total Procedure Duration: 0 hours 20 minutes 56 seconds  Findings:      Scattered small and large-mouthed diverticula were found in the sigmoid       colon and descending colon.      The terminal ileum appeared normal. Biopsies were taken with a cold       forceps for histology.      The colon (entire examined portion) appeared normal. Biopsies for       histology were taken with a cold forceps from the entire colon for       evaluation of microscopic colitis.      The exam was otherwise without abnormality. Impression:               - Diverticulosis in the sigmoid colon and in the                            descending colon.                           - The examined portion of the ileum was normal.                            Biopsied.                           - The entire  examined colon is normal. Biopsied.                           - The examination was otherwise normal. Moderate Sedation:      N/A- Per Anesthesia Care Recommendation:           - Patient has a contact number available for                            emergencies. The signs and symptoms of potential                            delayed complications were discussed with the                            patient. Return to normal activities tomorrow.                            Written discharge instructions were provided to the                            patient.                           - Soft diet today.                           - Continue present medications.                           - Await pathology results.                           -  Repeat colonoscopy in 5 years for screening                            purposes.                           - Return to GI office in 2 weeks.                           - Telephone GI clinic for pathology results in 4                            days.                           - Telephone GI clinic if symptomatic PRN. Will                            begin budesonide for microscopic colitis while we                            wait on biopsies Procedure Code(s):        --- Professional ---                           [phone], Colonoscopy, flexible; with biopsy, single                            or multiple Diagnosis Code(s):        --- Professional ---                           R19.7, Diarrhea, unspecified                           K57.30, Diverticulosis of large intestine without                            perforation or abscess without bleeding CPT copyright 2016 American Medical Association. All rights reserved. The codes documented in this report are preliminary and upon coder review may  be revised to meet current compliance requirements. Clarene Essex, MD 04/12/2017 12:31:36 PM This report has been signed electronically. Number of Addenda: 0

## 2017-04-12 NOTE — Transfer of Care (Signed)
Immediate Anesthesia Transfer of Care Note  Patient: PAMLA PANGLE  Procedure(s) Performed: COLONOSCOPY (N/A )  Patient Location: PACU  Anesthesia Type:MAC  Level of Consciousness: awake, alert  and oriented  Airway & Oxygen Therapy: Patient Spontanous Breathing and Patient connected to face mask oxygen  Post-op Assessment: Report given to RN and Post -op Vital signs reviewed and stable  Post vital signs: Reviewed and stable  Last Vitals:  Vitals:   04/12/17 0700 04/12/17 1137  BP: 104/73 113/86  Pulse: 83 89  Resp: 18 17  Temp: 36.8 C 36.8 C  SpO2: 98% 99%    Last Pain:  Vitals:   04/12/17 1137  TempSrc: Oral  PainSc:       Patients Stated Pain Goal: 3 (79/89/21 1941)  Complications: No apparent anesthesia complications

## 2017-04-12 NOTE — Progress Notes (Signed)
Patient ID: Janice Orr, female   DOB: May 27, 1956, 61 y.o.   MRN: [id number] [facility] Surgery Progress Note:   Day of Surgery  Subjective: Mental status is clear.   Objective: Vital signs in last 24 hours: Temp:  [97.7 F (36.5 C)-98.7 F (37.1 C)] 98.7 F (37.1 C) (10/15 1400) Pulse Rate:  [71-89] 74 (10/15 1400) Resp:  [14-20] 16 (10/15 1400) BP: (104-129)/(69-86) 129/85 (10/15 1400) SpO2:  [97 %-100 %] 99 % (10/15 1400) Weight:  [79.4 kg (175 lb)] 79.4 kg (175 lb) (10/15 1137)  Intake/Output from previous day: 10/14 0701 - 10/15 0700 In: 985 [P.O.:360; I.V.:625] Out: -  Intake/Output this shift: No intake/output data recorded.  Physical Exam: Work of breathing is normal.  Had rough night;  Colonoscopy today per Dr. Watt Climes didn't reveal any gross lesions and he will managing medical treatment.    Lab Results:  Results for orders placed or performed during the hospital encounter of 04/07/17 (from the past 48 hour(s))  Glia (IgA/G) + tTG IgA     Status: None   Collection Time: 04/11/17  8:57 AM  Result Value Ref Range   Antigliadin Abs, IgA 2 0 - 19 units    Comment: (NOTE)                   Negative                   0 - 19                   Weak Positive             20 - 30                   Moderate to Strong Positive   >30    Gliadin IgG 2 0 - 19 units    Comment: (NOTE)                   Negative                   0 - 19                   Weak Positive             20 - 30                   Moderate to Strong Positive   >30    Tissue Transglutaminase Ab, IgA <2 0 - 3 U/mL    Comment: (NOTE)                              Negative        0 -  3                              Weak Positive   4 - 10                              Positive           >10 Tissue Transglutaminase (tTG) has been identified as the endomysial antigen.  Studies have demonstr- ated that endomysial IgA antibodies have over 99% specificity for gluten sensitive enteropathy. Performed At: [facility] [address] [place], [place] [id number]  Lindon Romp MD KT:[id number]   TSH     Status: None   Collection Time: 04/11/17  8:57 AM  Result Value Ref Range   TSH 3.524 0.350 - 4.500 uIU/mL    Comment: Performed by a 3rd Generation assay with a functional sensitivity of <=0.01 uIU/mL.    Radiology/Results: No results found.  Anti-infectives: Anti-infectives    None      Assessment/Plan: Problem List: Patient Active Problem List   Diagnosis Date Noted  . Diarrhea with volume depletion 04/07/2017  . Volume depletion 04/07/2017  . History of repair of hiatal hernia with tube gastrostomy August 2018 01/28/2017  . Arthritis of right hip 10/30/2013    Assess progress tomorrow concerning discharge planning.   Day of Surgery    LOS: 3 days   Matt B. Hassell Done, MD, [facility] [phone] beeper [phone]  04/12/2017 9:09 PM

## 2017-04-12 NOTE — Anesthesia Postprocedure Evaluation (Signed)
Anesthesia Post Note  Patient: Janice Orr  Procedure(s) Performed: COLONOSCOPY (N/A )     Patient location during evaluation: PACU Anesthesia Type: MAC Level of consciousness: awake and alert Pain management: pain level controlled Vital Signs Assessment: post-procedure vital signs reviewed and stable Respiratory status: spontaneous breathing, nonlabored ventilation, respiratory function stable and patient connected to nasal cannula oxygen Cardiovascular status: stable and blood pressure returned to baseline Postop Assessment: no apparent nausea or vomiting Anesthetic complications: no    Last Vitals:  Vitals:   04/12/17 1240 04/12/17 1250  BP: 119/78 117/82  Pulse: 79 80  Resp: 20 14  Temp:    SpO2: 100% 98%    Last Pain:  Vitals:   04/12/17 1231  TempSrc: Oral  PainSc:                  Nobuo Nunziata S

## 2017-04-12 NOTE — Anesthesia Preprocedure Evaluation (Signed)
Anesthesia Evaluation  Patient identified by MRN, date of birth, ID band Patient awake    Reviewed: Allergy & Precautions, NPO status , Patient's Chart, lab work & pertinent test results  Airway Mallampati: II  TM Distance: >3 FB Neck ROM: Full    Dental no notable dental hx.    Pulmonary neg pulmonary ROS,    Pulmonary exam normal breath sounds clear to auscultation       Cardiovascular hypertension, Normal cardiovascular exam Rhythm:Regular Rate:Normal     Neuro/Psych negative neurological ROS  negative psych ROS   GI/Hepatic Neg liver ROS,   Endo/Other  negative endocrine ROS  Renal/GU negative Renal ROS  negative genitourinary   Musculoskeletal negative musculoskeletal ROS (+)   Abdominal   Peds negative pediatric ROS (+)  Hematology negative hematology ROS (+)   Anesthesia Other Findings   Reproductive/Obstetrics negative OB ROS                             Anesthesia Physical Anesthesia Plan  ASA: II  Anesthesia Plan: MAC   Post-op Pain Management:    Induction: Intravenous  PONV Risk Score and Plan: 0  Airway Management Planned: Simple Face Mask  Additional Equipment:   Intra-op Plan:   Post-operative Plan:   Informed Consent: I have reviewed the patients History and Physical, chart, labs and discussed the procedure including the risks, benefits and alternatives for the proposed anesthesia with the patient or authorized representative who has indicated his/her understanding and acceptance.   Dental advisory given  Plan Discussed with: CRNA and Surgeon  Anesthesia Plan Comments:         Anesthesia Quick Evaluation

## 2017-04-12 NOTE — Progress Notes (Signed)
Janice Orr 11:54 AM  Subjective: Patient without any new complaints still with diarrhea 1 time bilious vomiting yesterday questionable cause otherwise okay and did sleep okay  Objective: Vital signs stable afebrile no acute distress exam please see preassessment evaluation TSH okay  Assessment: Diarrhea questionable etiology  Plan: Okay to proceed with flexible sigmoidoscopy with anesthesia assistance with further workup and plans pending those findings  [facility] E  Pager [phone] After 5PM or if no answer call [phone]

## 2017-04-13 ENCOUNTER — Encounter (HOSPITAL_COMMUNITY): Payer: Self-pay

## 2017-04-13 MED ORDER — BUDESONIDE 3 MG PO CPEP
9.0000 mg | ORAL_CAPSULE | Freq: Every day | ORAL | Status: DC
  Administered 2017-04-13 – 2017-04-14 (×2): 9 mg via ORAL
  Filled 2017-04-13 (×2): qty 3

## 2017-04-13 NOTE — Progress Notes (Signed)
Janice Orr 11:52 AM  Subjective: Patient without any new complaints and no problems from her colonoscopy unfortunately she has not started her budesonide yet but she did sleep some and is eating better  Objective: Vital signs stable afebrile no acute distress abdomen is soft nontender  Assessment: Diarrhea questionably etiology  Plan: Will begin budesonide for microscopic colitis while we wait on the biopsies and hopefully she'll be able to go home soon when she is able to make it to the bathroom in time and drink enough liquids to keep from getting dehydrated  [facility] E  Pager [phone] After 5PM or if no answer call [phone]

## 2017-04-13 NOTE — Plan of Care (Signed)
Problem: Safety: Goal: Ability to remain free from injury will improve Outcome: Progressing No fall or injury noted, safety precautions mainatained  Problem: Pain Managment: Goal: General experience of comfort will improve Outcome: Progressing Denies pain  Problem: Skin Integrity: Goal: Risk for impaired skin integrity will decrease Outcome: Progressing No skin impairement noted  Problem: Activity: Goal: Risk for activity intolerance will decrease Outcome: Progressing Tolerates activities well  Problem: Bowel/Gastric: Goal: Will not experience complications related to bowel motility Outcome: Not Progressing Continues to have diarrhea

## 2017-04-13 NOTE — Progress Notes (Signed)
Patient ID: RUFUS CYPERT, female   DOB: [birth year], 61 y.o.   MRN: [id number] [facility] Surgery Progress Note:   1 Day Post-Op  Subjective: Mental status is clear. Objective: Vital signs in last 24 hours: Temp:  [98.3 F (36.8 C)-98.7 F (37.1 C)] 98.4 F (36.9 C) (10/16 0515) Pulse Rate:  [74-82] 82 (10/16 0515) Resp:  [16-17] 17 (10/16 0515) BP: (106-129)/(58-85) 106/58 (10/16 0515) SpO2:  [98 %-99 %] 98 % (10/16 0515)  Intake/Output from previous day: 10/15 0701 - 10/16 0700 In: 1210 [P.O.:610; I.V.:600] Out: -  Intake/Output this shift: No intake/output data recorded.  Physical Exam: Work of breathing is normal.  Still having urgency and diarrhea;  Medications ordered yesterday by Dr. Watt Climes have not been given  Lab Results:  No results found for this or any previous visit (from the past 71 hour(s)).  Radiology/Results: No results found.  Anti-infectives: Anti-infectives    None      Assessment/Plan: Problem List: Patient Active Problem List   Diagnosis Date Noted  . Diarrhea with volume depletion 04/07/2017  . Volume depletion 04/07/2017  . History of repair of hiatal hernia with tube gastrostomy August 2018 01/28/2017  . Arthritis of right hip 10/30/2013    Unable to consider discharge until symptomatically better-disappointed in meds not given.   1 Day Post-Op    LOS: 4 days   Matt B. Hassell Done, MD, [facility] [phone] beeper [phone]  04/13/2017 1:06 PM

## 2017-04-14 LAB — URINALYSIS, ROUTINE W REFLEX MICROSCOPIC
BILIRUBIN URINE: NEGATIVE
GLUCOSE, UA: NEGATIVE mg/dL
HGB URINE DIPSTICK: NEGATIVE
Ketones, ur: NEGATIVE mg/dL
NITRITE: NEGATIVE
PH: 6 (ref 5.0–8.0)
Protein, ur: NEGATIVE mg/dL
SPECIFIC GRAVITY, URINE: 1.017 (ref 1.005–1.030)

## 2017-04-14 MED ORDER — FLORANEX PO PACK: 1.0000 g | PACK | Freq: Three times a day (TID) | ORAL | 1 refills | Status: AC

## 2017-04-14 MED ORDER — COLESTIPOL HCL 1 G PO TABS: 2.0000 g | ORAL_TABLET | Freq: Two times a day (BID) | ORAL | 1 refills | Status: AC

## 2017-04-14 MED ORDER — PANTOPRAZOLE SODIUM 20 MG PO TBEC: 20.0000 mg | DELAYED_RELEASE_TABLET | Freq: Every day | ORAL | 1 refills | Status: AC

## 2017-04-14 MED ORDER — DIPHENOXYLATE-ATROPINE 2.5-0.025 MG PO TABS: 1.0000 | ORAL_TABLET | Freq: Four times a day (QID) | ORAL | 0 refills | Status: AC

## 2017-04-14 MED ORDER — BUDESONIDE 3 MG PO CPEP: 9.0000 mg | ORAL_CAPSULE | Freq: Every day | ORAL | 1 refills | Status: AC

## 2017-04-14 NOTE — Progress Notes (Signed)
Janice Orr 2:25 PM  Subjective: Patient doing better from a GI standpoint and agrees with well enough to go home and she did sleep well and her diarrhea is better and her only new complaint is some urinary pressure and she's had urine tract infections before  Objective: Vital signs stable afebrile patient looks good biopsies confirm microscopic colitis which we discussed  Assessment: Microscopic colitis  Plan: Okay with me to go home I asked the nurse to colectomy urine sample which we can check Thursday or Friday and my nurse will call and budesonide 3 day and she can use over-the-counter constipating meds if need be in addition and call me when necessary otherwise follow-up in about 3 weeks  [facility] E  Pager [phone] After 5PM or if no answer call [phone]

## 2017-04-14 NOTE — Discharge Instructions (Signed)
Follow Dr. Perley Jain guidance Reduce your PPIs as low as you can tolerate Take probiotic as suggested.

## 2017-04-14 NOTE — Progress Notes (Signed)
Patient ID: Janice Orr, female   DOB: 09-Oct-1955, 61 y.o.   MRN: [id number] [facility] Surgery Progress Note:   2 Days Post-Op  Subjective: Mental status is alert and pleasant.  Had a better night.   Objective: Vital signs in last 24 hours: Temp:  [98 F (36.7 C)-98.5 F (36.9 C)] 98.1 F (36.7 C) (10/17 0519) Pulse Rate:  [73-82] 73 (10/17 0519) Resp:  [16-17] 17 (10/17 0519) BP: (102-108)/(56-74) 102/56 (10/17 0519) SpO2:  [97 %-100 %] 97 % (10/17 0519)  Intake/Output from previous day: 10/16 0701 - 10/17 0700 In: 820 [P.O.:820] Out: -  Intake/Output this shift: No intake/output data recorded.  Physical Exam: Work of breathing is normal.  Has had less diarrhea.    Lab Results:  No results found for this or any previous visit (from the past 48 hour(s)).  Radiology/Results: No results found.  Anti-infectives: Anti-infectives    None      Assessment/Plan: Problem List: Patient Active Problem List   Diagnosis Date Noted  . Diarrhea with volume depletion 04/07/2017  . Volume depletion 04/07/2017  . History of repair of hiatal hernia with tube gastrostomy August 2018 01/28/2017  . Arthritis of right hip 10/30/2013    Will defer to Dr. Watt Climes if she can go home today.   2 Days Post-Op    LOS: 5 days   Matt B. Hassell Done, MD, [facility] [phone] beeper [phone]  04/14/2017 7:47 AM

## 2017-04-15 ENCOUNTER — Encounter (HOSPITAL_COMMUNITY): Payer: Self-pay | Admitting: Gastroenterology

## 2017-04-16 NOTE — Discharge Summary (Signed)
Physician Discharge Summary  Patient ID: Janice Orr MRN: [id number] DOB/AGE: 10-25-1955 61 y.o.  Admit date: 04/07/2017 Discharge date: 04/14/2017  Admission Diagnoses:  Diarrhea and volume depletion  Discharge Diagnoses:  Same; etiology uncertain  Principal Problem:   Diarrhea with volume depletion Active Problems:   Volume depletion   Surgery:  None except colonoscopy by Dr. Watt Climes  Discharged Condition: improved  Hospital Course:   Was admitted from the office.  Rehydration and observation.  Stool cultures negative.  Consult by Dr. Watt Climes with colonoscopy.  Improvement and discharge  Consults: Dr. Clarene Essex  Significant Diagnostic Studies: colonoscopy and stool cultures    Discharge Exam: Blood pressure 113/72, pulse 79, temperature 98.3 F (36.8 C), temperature source Oral, resp. rate 16, height 5\' 8"  (1.727 m), weight 79.4 kg (175 lb), SpO2 99 %. Abdomen is nontender  Disposition: 01-Home or Self Care  Discharge Instructions    Diet - low sodium heart healthy    Complete by:  As directed    Increase activity slowly    Complete by:  As directed      Allergies as of 04/14/2017      Reactions   Watermelon Flavor Anaphylaxis, Hives   Claritin-d 12 Hour [loratadine-pseudoephedrine Er] Other (See Comments)   Possible allergic reaction   Codeine Hives   Hallucinations   Z-pak [azithromycin] Hives, Diarrhea      Medication List    STOP taking these medications   traMADol 50 MG tablet Commonly known as:  ULTRAM     TAKE these medications   budesonide 3 MG 24 hr capsule Commonly known as:  ENTOCORT EC Take 3 capsules (9 mg total) by mouth daily.   colestipol 1 g tablet Commonly known as:  COLESTID Take 2 tablets (2 g total) by mouth 2 (two) times daily.   DEXILANT 60 MG capsule Generic drug:  dexlansoprazole Take 60 mg by mouth daily.   diphenoxylate-atropine 2.5-0.025 MG tablet Commonly known as:  LOMOTIL Take 1 tablet by mouth 4 (four) times  daily.   fluticasone 50 MCG/ACT nasal spray Commonly known as:  FLONASE 1 spray each nares bid What changed:  how much to take  how to take this  when to take this  reasons to take this  additional instructions   HYDROcodone-acetaminophen 5-325 MG tablet Commonly known as:  NORCO/VICODIN Take 1 tablet by mouth every 4 (four) hours as needed for moderate pain.   ibuprofen 200 MG tablet Commonly known as:  ADVIL,MOTRIN Take 600-800 mg by mouth every 6 (six) hours as needed for headache or mild pain.   lactobacillus Pack Take 1 packet (1 g total) by mouth 3 (three) times daily with meals.   liver oil-zinc oxide 40 % ointment Commonly known as:  DESITIN Apply 1 application topically 2 (two) times daily as needed (on wound infection).   loratadine 10 MG tablet Commonly known as:  CLARITIN Take 10 mg by mouth daily.   LORazepam 0.5 MG tablet Commonly known as:  ATIVAN Take 0.25-0.5 mg by mouth every 8 (eight) hours as needed for anxiety.   metoprolol succinate 25 MG 24 hr tablet Commonly known as:  TOPROL-XL Take 25 mg by mouth daily.   ondansetron 4 MG disintegrating tablet Commonly known as:  ZOFRAN-ODT Take 1 tablet (4 mg total) by mouth every 6 (six) hours as needed for nausea.   pantoprazole 20 MG tablet Commonly known as:  PROTONIX Take 1 tablet (20 mg total) by mouth daily.   promethazine 25  MG tablet Commonly known as:  PHENERGAN Take 25 mg by mouth every 8 (eight) hours as needed for nausea or vomiting.   simvastatin 20 MG tablet Commonly known as:  ZOCOR Take 20 mg by mouth daily.   triamcinolone cream 0.1 % Commonly known as:  KENALOG Apply 1 application topically 2 (two) times daily as needed (for skin irritation or rash).   venlafaxine XR 150 MG 24 hr capsule Commonly known as:  EFFEXOR-XR Take 150 mg by mouth daily.   zolpidem 10 MG tablet Commonly known as:  AMBIEN Take 10 mg by mouth at bedtime.      Follow-up Information    Clarene Essex, MD. Schedule an appointment as soon as possible for a visit in 4 week(s).   Specialty:  Gastroenterology Contact information: [address] [place] [address] [phone]        Johnathan Hausen, MD Follow up in 4 week(s).   Specialty:  General Surgery Contact information: [place] [address] [phone]           Signed: Pedro Earls 04/16/2017, 7:14 AM

## 2017-04-21 DIAGNOSIS — Z9889 Other specified postprocedural states: Secondary | ICD-10-CM | POA: Diagnosis not present

## 2017-04-21 DIAGNOSIS — E7800 Pure hypercholesterolemia, unspecified: Secondary | ICD-10-CM | POA: Diagnosis not present

## 2017-04-21 DIAGNOSIS — Z23 Encounter for immunization: Secondary | ICD-10-CM | POA: Diagnosis not present

## 2017-04-21 DIAGNOSIS — K52839 Microscopic colitis, unspecified: Secondary | ICD-10-CM | POA: Diagnosis not present

## 2017-04-22 DIAGNOSIS — Z1231 Encounter for screening mammogram for malignant neoplasm of breast: Secondary | ICD-10-CM | POA: Diagnosis not present

## 2017-04-22 DIAGNOSIS — Z803 Family history of malignant neoplasm of breast: Secondary | ICD-10-CM | POA: Diagnosis not present

## 2017-05-06 DIAGNOSIS — K52839 Microscopic colitis, unspecified: Secondary | ICD-10-CM | POA: Diagnosis not present

## 2017-05-31 DIAGNOSIS — L570 Actinic keratosis: Secondary | ICD-10-CM | POA: Diagnosis not present

## 2017-05-31 DIAGNOSIS — D225 Melanocytic nevi of trunk: Secondary | ICD-10-CM | POA: Diagnosis not present

## 2017-05-31 DIAGNOSIS — L821 Other seborrheic keratosis: Secondary | ICD-10-CM | POA: Diagnosis not present

## 2017-05-31 DIAGNOSIS — Z85828 Personal history of other malignant neoplasm of skin: Secondary | ICD-10-CM | POA: Diagnosis not present

## 2017-05-31 DIAGNOSIS — D485 Neoplasm of uncertain behavior of skin: Secondary | ICD-10-CM | POA: Diagnosis not present

## 2017-07-01 DIAGNOSIS — K52839 Microscopic colitis, unspecified: Secondary | ICD-10-CM | POA: Diagnosis not present

## 2017-07-07 DIAGNOSIS — C44529 Squamous cell carcinoma of skin of other part of trunk: Secondary | ICD-10-CM | POA: Diagnosis not present

## 2017-07-07 DIAGNOSIS — L905 Scar conditions and fibrosis of skin: Secondary | ICD-10-CM | POA: Diagnosis not present

## 2017-08-05 DIAGNOSIS — C44619 Basal cell carcinoma of skin of left upper limb, including shoulder: Secondary | ICD-10-CM | POA: Diagnosis not present

## 2017-08-05 DIAGNOSIS — C44712 Basal cell carcinoma of skin of right lower limb, including hip: Secondary | ICD-10-CM | POA: Diagnosis not present

## 2017-10-13 DIAGNOSIS — K52839 Microscopic colitis, unspecified: Secondary | ICD-10-CM | POA: Diagnosis not present

## 2017-10-13 DIAGNOSIS — K219 Gastro-esophageal reflux disease without esophagitis: Secondary | ICD-10-CM | POA: Diagnosis not present

## 2017-11-17 ENCOUNTER — Other Ambulatory Visit (HOSPITAL_COMMUNITY)
Admission: RE | Admit: 2017-11-17 | Discharge: 2017-11-17 | Disposition: A | Discharge status: Home / Self Care | Payer: 59 | Source: Ambulatory Visit | Attending: Family Medicine | Admitting: Family Medicine

## 2017-11-17 ENCOUNTER — Other Ambulatory Visit: Payer: Self-pay | Admitting: Family Medicine

## 2017-11-17 DIAGNOSIS — N183 Chronic kidney disease, stage 3 (moderate): Secondary | ICD-10-CM | POA: Diagnosis not present

## 2017-11-17 DIAGNOSIS — Z124 Encounter for screening for malignant neoplasm of cervix: Secondary | ICD-10-CM | POA: Diagnosis not present

## 2017-11-17 DIAGNOSIS — E7800 Pure hypercholesterolemia, unspecified: Secondary | ICD-10-CM | POA: Diagnosis not present

## 2017-11-17 DIAGNOSIS — E559 Vitamin D deficiency, unspecified: Secondary | ICD-10-CM | POA: Diagnosis not present

## 2017-11-17 DIAGNOSIS — Z0000 Encounter for general adult medical examination without abnormal findings: Secondary | ICD-10-CM | POA: Diagnosis not present

## 2017-11-18 LAB — CYTOLOGY - PAP
Diagnosis: NEGATIVE
HPV: NOT DETECTED

## 2018-02-09 DIAGNOSIS — K52839 Microscopic colitis, unspecified: Secondary | ICD-10-CM | POA: Diagnosis not present

## 2018-02-09 DIAGNOSIS — K219 Gastro-esophageal reflux disease without esophagitis: Secondary | ICD-10-CM | POA: Diagnosis not present

## 2018-06-06 DIAGNOSIS — L570 Actinic keratosis: Secondary | ICD-10-CM | POA: Diagnosis not present

## 2018-06-06 DIAGNOSIS — D225 Melanocytic nevi of trunk: Secondary | ICD-10-CM | POA: Diagnosis not present

## 2018-06-06 DIAGNOSIS — D485 Neoplasm of uncertain behavior of skin: Secondary | ICD-10-CM | POA: Diagnosis not present

## 2018-06-24 DIAGNOSIS — Z803 Family history of malignant neoplasm of breast: Secondary | ICD-10-CM | POA: Diagnosis not present

## 2018-06-24 DIAGNOSIS — Z1231 Encounter for screening mammogram for malignant neoplasm of breast: Secondary | ICD-10-CM | POA: Diagnosis not present

## 2018-09-15 DIAGNOSIS — N390 Urinary tract infection, site not specified: Secondary | ICD-10-CM | POA: Diagnosis not present

## 2018-09-15 DIAGNOSIS — R110 Nausea: Secondary | ICD-10-CM | POA: Diagnosis not present

## 2018-10-14 DIAGNOSIS — N183 Chronic kidney disease, stage 3 (moderate): Secondary | ICD-10-CM | POA: Diagnosis not present

## 2018-10-14 DIAGNOSIS — I129 Hypertensive chronic kidney disease with stage 1 through stage 4 chronic kidney disease, or unspecified chronic kidney disease: Secondary | ICD-10-CM | POA: Diagnosis not present

## 2019-09-18 ENCOUNTER — Emergency Department (HOSPITAL_BASED_OUTPATIENT_CLINIC_OR_DEPARTMENT_OTHER)
Admission: EM | Admit: 2019-09-18 | Discharge: 2019-09-18 | Disposition: A | Discharge status: Home / Self Care | Payer: Medicare Other | Attending: Emergency Medicine | Admitting: Emergency Medicine

## 2019-09-18 ENCOUNTER — Emergency Department (HOSPITAL_BASED_OUTPATIENT_CLINIC_OR_DEPARTMENT_OTHER): Payer: Medicare Other

## 2019-09-18 ENCOUNTER — Encounter (HOSPITAL_BASED_OUTPATIENT_CLINIC_OR_DEPARTMENT_OTHER): Payer: Self-pay | Admitting: *Deleted

## 2019-09-18 ENCOUNTER — Other Ambulatory Visit: Payer: Self-pay

## 2019-09-18 DIAGNOSIS — E876 Hypokalemia: Secondary | ICD-10-CM

## 2019-09-18 DIAGNOSIS — I10 Essential (primary) hypertension: Secondary | ICD-10-CM | POA: Insufficient documentation

## 2019-09-18 DIAGNOSIS — R110 Nausea: Secondary | ICD-10-CM | POA: Diagnosis not present

## 2019-09-18 DIAGNOSIS — R197 Diarrhea, unspecified: Secondary | ICD-10-CM

## 2019-09-18 DIAGNOSIS — K523 Indeterminate colitis: Secondary | ICD-10-CM | POA: Diagnosis not present

## 2019-09-18 DIAGNOSIS — Z79899 Other long term (current) drug therapy: Secondary | ICD-10-CM | POA: Diagnosis not present

## 2019-09-18 HISTORY — DX: Ulcerative colitis, unspecified, without complications: K51.90

## 2019-09-18 LAB — CBC WITH DIFFERENTIAL/PLATELET
Abs Immature Granulocytes: 0.06 10*3/uL (ref 0.00–0.07)
Basophils Absolute: 0 10*3/uL (ref 0.0–0.1)
Basophils Relative: 0 %
Eosinophils Absolute: 0.5 10*3/uL (ref 0.0–0.5)
Eosinophils Relative: 4 %
HCT: 44.9 % (ref 36.0–46.0)
Hemoglobin: 14.4 g/dL (ref 12.0–15.0)
Immature Granulocytes: 0 %
Lymphocytes Relative: 15 %
Lymphs Abs: 2 10*3/uL (ref 0.7–4.0)
MCH: 31.4 pg (ref 26.0–34.0)
MCHC: 32.1 g/dL (ref 30.0–36.0)
MCV: 97.8 fL (ref 80.0–100.0)
Monocytes Absolute: 0.7 10*3/uL (ref 0.1–1.0)
Monocytes Relative: 6 %
Neutro Abs: 10.1 10*3/uL — ABNORMAL HIGH (ref 1.7–7.7)
Neutrophils Relative %: 75 %
Platelets: 367 10*3/uL (ref 150–400)
RBC: 4.59 MIL/uL (ref 3.87–5.11)
RDW: 13.2 % (ref 11.5–15.5)
WBC: 13.4 10*3/uL — ABNORMAL HIGH (ref 4.0–10.5)
nRBC: 0 % (ref 0.0–0.2)

## 2019-09-18 LAB — LIPASE, BLOOD: Lipase: 22 U/L (ref 11–51)

## 2019-09-18 LAB — COMPREHENSIVE METABOLIC PANEL
ALT: 31 U/L (ref 0–44)
AST: 34 U/L (ref 15–41)
Albumin: 4.4 g/dL (ref 3.5–5.0)
Alkaline Phosphatase: 99 U/L (ref 38–126)
Anion gap: 11 (ref 5–15)
BUN: 13 mg/dL (ref 8–23)
CO2: 21 mmol/L — ABNORMAL LOW (ref 22–32)
Calcium: 8.6 mg/dL — ABNORMAL LOW (ref 8.9–10.3)
Chloride: 104 mmol/L (ref 98–111)
Creatinine, Ser: 0.98 mg/dL (ref 0.44–1.00)
GFR calc Af Amer: >60 mL/min (ref >60)
GFR calc non Af Amer: >60 mL/min (ref >60)
Glucose, Bld: 133 mg/dL — ABNORMAL HIGH (ref 70–99)
Potassium: 3.3 mmol/L — ABNORMAL LOW (ref 3.5–5.1)
Sodium: 136 mmol/L (ref 135–145)
Total Bilirubin: 0.4 mg/dL (ref 0.3–1.2)
Total Protein: 7.7 g/dL (ref 6.5–8.1)

## 2019-09-18 MED ORDER — IOHEXOL 300 MG/ML  SOLN
100.0000 mL | Freq: Once | INTRAMUSCULAR | Status: AC
  Administered 2019-09-18: 100 mL via INTRAVENOUS

## 2019-09-18 MED ORDER — SODIUM CHLORIDE 0.9 % IV BOLUS
500.0000 mL | Freq: Once | INTRAVENOUS | Status: AC
  Administered 2019-09-18: 500 mL via INTRAVENOUS

## 2019-09-18 MED ORDER — LOPERAMIDE HCL 2 MG PO TABS: 2.0000 mg | ORAL_TABLET | Freq: Four times a day (QID) | ORAL | 0 refills | Status: AC | PRN

## 2019-09-18 MED ORDER — SODIUM CHLORIDE 0.9 % IV SOLN: INTRAVENOUS | Status: DC

## 2019-09-18 NOTE — Discharge Instructions (Signed)
CT scan of the abdomen without any acute findings.  Little bit of mild low potassium.  Eat foods high in potassium which includes bananas.  Also include spinach.  Trial of Imodium AD.  Prescription provided.  May be cheaper over-the-counter.  Contact your gastroenterologist for follow-up.  If symptoms persist they may want to do stool cultures.  And or colonoscopy.  Return for any new or worse symptoms.

## 2019-09-18 NOTE — ED Provider Notes (Signed)
[facility] EMERGENCY DEPARTMENT Provider Note   CSN: [id number] Arrival date & time: 09/18/19  1455     History Chief Complaint  Patient presents with  . Diarrhea    Janice Orr is a 64 y.o. female.  Patient with onset of diarrhea on Wednesday.  6 days ago.  No blood in the diarrhea.  Approximately 6-8 loose bowel movements a day.  Somewhat foul-smelling patient states.  Patient's not been on antibiotics recently.  But she does have a history of ulcerative colitis.  He has been couple of years since she has had any problems with this.  Is followed by [facility] gastroenterology Dr. Watt Climes.  Some nausea no vomiting.  No no significant abdominal pain or cramping.  Patient did have the second [place] vaccine on Monday a week ago.  Did not have any particular side effects that day or the day after.  But on Wednesday did start with the diarrhea.  Also no fevers.        Past Medical History:  Diagnosis Date  . Anemia    h/o of....none at the moment  . Anxiety   . Arthritis   . Depression   . Dysrhythmia    per patient , they said  " i have a fast hear rate"   . Family history of anesthesia complication    father hard to wake up and N/V  . H/O hiatal hernia   . Hypertension   . PONV (postoperative nausea and vomiting)   . Shortness of breath    states its from anxiety & allergies  . UC (ulcerative colitis) [facility])     Patient Active Problem List   Diagnosis Date Noted  . Diarrhea with volume depletion 04/07/2017  . Volume depletion 04/07/2017  . History of repair of hiatal hernia with tube gastrostomy August 2018 01/28/2017  . Arthritis of right hip 10/30/2013    Past Surgical History:  Procedure Laterality Date  . BACK SURGERY     lumbar  . CHOLECYSTECTOMY    . COLONOSCOPY N/A 04/12/2017   Procedure: COLONOSCOPY;  Surgeon: Clarene Essex, MD;  Location: [facility] ENDOSCOPY;  Service: Endoscopy;  Laterality: N/A;  . HERNIA REPAIR    . HIATAL HERNIA REPAIR N/A 01/28/2017    Procedure: LAPAROSCOPIC REPAIR OF LARGE HIATAL HERNIA;  Surgeon: Johnathan Hausen, MD;  Location: [facility] ORS;  Service: General;  Laterality: N/A;  . MOHS SURGERY    . 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 TOTAL HIP ARTHROPLASTY Right 10/30/2013   Procedure: RIGHT TOTAL HIP ARTHROPLASTY;  Surgeon: Kerin Salen, MD;  Location: [facility];  Service: Orthopedics;  Laterality: Right;     OB History   No obstetric history on file.     No family history on file.  Social History   Tobacco Use  . Smoking status: Never Smoker  . Smokeless tobacco: Never Used  Substance Use Topics  . Alcohol use: Yes    Comment: occasional  . Drug use: No    Home Medications Prior to Admission medications   Medication Sig Start Date End Date Taking? Authorizing Provider  loratadine (CLARITIN) 10 MG tablet Take 10 mg by mouth daily. 12/21/16  Yes [provider]  LORazepam (ATIVAN) 0.5 MG tablet Take 0.25-0.5 mg by mouth every 8 (eight) hours as needed for anxiety.    Yes [provider]  metoprolol succinate (TOPROL-XL) 25 MG 24 hr tablet Take 25 mg by mouth daily.   Yes [provider]  ondansetron (ZOFRAN-ODT) 4 MG disintegrating tablet Take 1 tablet (4 mg total) by mouth every 6 (six) hours as needed for nausea. 01/31/17  Yes Kinsinger, Arta Bruce, MD  pantoprazole (PROTONIX) 20 MG tablet Take 1 tablet (20 mg total) by mouth daily. 04/15/17  Yes Johnathan Hausen, MD  simvastatin (ZOCOR) 20 MG tablet Take 20 mg by mouth daily.   Yes [provider]  venlafaxine XR (EFFEXOR-XR) 150 MG 24 hr capsule Take 150 mg by mouth daily. 11/24/16  Yes [provider]  zolpidem (AMBIEN) 10 MG tablet Take 10 mg by mouth at bedtime.    Yes [provider]  budesonide (ENTOCORT EC) 3 MG 24 hr capsule Take 3 capsules (9 mg total) by mouth daily. 04/15/17   Johnathan Hausen, MD  colestipol (COLESTID) 1 g tablet Take 2 tablets (2 g total) by mouth 2 (two) times daily.  04/14/17   Johnathan Hausen, MD  dexlansoprazole (DEXILANT) 60 MG capsule Take 60 mg by mouth daily.    [provider]  diphenoxylate-atropine (LOMOTIL) 2.5-0.025 MG tablet Take 1 tablet by mouth 4 (four) times daily. 04/14/17   Johnathan Hausen, MD  fluticasone [facility]) 50 MCG/ACT nasal spray 1 spray each nares bid Patient taking differently: Place 1 spray into both nostrils 2 (two) times daily as needed for allergies.  12/13/13   Tanna Furry, MD  HYDROcodone-acetaminophen (NORCO/VICODIN) 5-325 MG tablet Take 1 tablet by mouth every 4 (four) hours as needed for moderate pain. Patient not taking: Reported on 04/07/2017 01/31/17   Kinsinger, Arta Bruce, MD  ibuprofen (ADVIL,MOTRIN) 200 MG tablet Take 600-800 mg by mouth every 6 (six) hours as needed for headache or mild pain.    [provider]  lactobacillus (FLORANEX/LACTINEX) PACK Take 1 packet (1 g total) by mouth 3 (three) times daily with meals. 04/14/17   Johnathan Hausen, MD  liver oil-zinc oxide (DESITIN) 40 % ointment Apply 1 application topically 2 (two) times daily as needed (on wound infection).    [provider]  loperamide (IMODIUM A-D) 2 MG tablet Take 1 tablet (2 mg total) by mouth 4 (four) times daily as needed for diarrhea or loose stools. 09/18/19   Fredia Sorrow, MD  promethazine (PHENERGAN) 25 MG tablet Take 25 mg by mouth every 8 (eight) hours as needed for nausea or vomiting.    [provider]  triamcinolone cream (KENALOG) 0.1 % Apply 1 application topically 2 (two) times daily as needed (for skin irritation or rash).  11/12/16   [provider]    Allergies    Watermelon flavor, Claritin-d 12 hour [loratadine-pseudoephedrine er], Codeine, and Z-pak [azithromycin]  Review of Systems   Review of Systems  Constitutional: Negative for chills and fever.  HENT: Negative for rhinorrhea and sore throat.   Eyes: Negative for visual disturbance.  Respiratory: Negative for cough and  shortness of breath.   Cardiovascular: Negative for chest pain and leg swelling.  Gastrointestinal: Positive for diarrhea and nausea. Negative for abdominal pain, blood in stool and vomiting.  Genitourinary: Negative for dysuria.  Musculoskeletal: Negative for back pain and neck pain.  Skin: Negative for rash.  Neurological: Negative for dizziness, light-headedness and headaches.  Hematological: Does not bruise/bleed easily.  Psychiatric/Behavioral: Negative for confusion.    Physical Exam Updated Vital Signs BP (!) 137/97   Pulse (!) 106   Temp 97.8 F (36.6 C) (Oral)   Resp 20   Ht 1.753 m (5\' 9" )   Wt 90.7 kg  SpO2 100%   BMI 29.53 kg/m   Physical Exam Vitals and nursing note reviewed.  Constitutional:      General: She is not in acute distress.    Appearance: Normal appearance. She is well-developed.  HENT:     Head: Normocephalic and atraumatic.  Eyes:     Extraocular Movements: Extraocular movements intact.     Conjunctiva/sclera: Conjunctivae normal.     Pupils: Pupils are equal, round, and reactive to light.  Cardiovascular:     Rate and Rhythm: Normal rate and regular rhythm.     Heart sounds: No murmur.  Pulmonary:     Effort: Pulmonary effort is normal. No respiratory distress.     Breath sounds: Normal breath sounds.  Abdominal:     General: There is no distension.     Palpations: Abdomen is soft.     Tenderness: There is no abdominal tenderness.  Musculoskeletal:        General: Normal range of motion.     Cervical back: Normal range of motion and neck supple.  Skin:    General: Skin is warm and dry.     Capillary Refill: Capillary refill takes less than 2 seconds.  Neurological:     General: No focal deficit present.     Mental Status: She is alert and oriented to person, place, and time.     ED Results / Procedures / Treatments   Labs (all labs ordered are listed, but only abnormal results are displayed) Labs Reviewed  COMPREHENSIVE  METABOLIC PANEL - Abnormal; Notable for the following components:      Result Value   Potassium 3.3 (*)    CO2 21 (*)    Glucose, Bld 133 (*)    Calcium 8.6 (*)    All other components within normal limits  CBC WITH DIFFERENTIAL/PLATELET - Abnormal; Notable for the following components:   WBC 13.4 (*)    Neutro Abs 10.1 (*)    All other components within normal limits  LIPASE, BLOOD    EKG None   ED ECG REPORT   Date: 09/18/2019  Rate: 90  Rhythm: normal sinus rhythm  QRS Axis: normal  Intervals: normal  ST/T Wave abnormalities: ST depressions inferiorly  Conduction Disutrbances:none  Narrative Interpretation:   Old EKG Reviewed: none available Abnormal R wave progression early transition.  Minimal ST depression inferior leads.  Baseline wandering in leads II, III, aVR, aVF and V6.  I have personally reviewed the EKG tracing and agree with the computerized printout as noted.   Radiology CT Abdomen Pelvis W Contrast  Result Date: 09/18/2019 CLINICAL DATA:  Inflammatory bowel disease. Diarrhea. Weakness. EXAM: CT ABDOMEN AND PELVIS WITH CONTRAST TECHNIQUE: Multidetector CT imaging of the abdomen and pelvis was performed using the standard protocol following bolus administration of intravenous contrast. CONTRAST:  166mL OMNIPAQUE IOHEXOL 300 MG/ML  SOLN COMPARISON:  CT scan dated 04/08/2017 FINDINGS: Lower chest: Chronic large hiatal hernia. The entire fundus of the stomach is within the hernia. Hepatobiliary: No focal liver abnormality is seen. Status post cholecystectomy. No biliary dilatation. Pancreas: Unremarkable. No pancreatic ductal dilatation or surrounding inflammatory changes. Spleen: Normal in size without focal abnormality. Adrenals/Urinary Tract: Tiny stable cyst in the mid right kidney. Kidneys are otherwise normal. Adrenal glands are normal. Bladder is normal. Some detail of the bladder is obscured by the metallic artifact from the right hip prosthesis. Stomach/Bowel:  Large chronic hiatal hernia. The small bowel is normal including the terminal ileum. Numerous diverticula throughout the colon  most extensive in the sigmoid region without diverticulitis. Appendix is normal. Vascular/Lymphatic: No significant vascular findings are present. No enlarged abdominal or pelvic lymph nodes. Reproductive: Uterus and bilateral adnexa are unremarkable. Other: No abdominal wall hernia or abnormality. No abdominopelvic ascites. Musculoskeletal: No acute osseous findings. 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 IMPRESSION: 1. No acute abnormalities of the abdomen or pelvis. 2. Extensive diverticulosis of the colon. 3. Large chronic hiatal hernia. Electronically Signed   By: Lorriane Shire M.D.   On: 09/18/2019 16:35    Procedures Procedures (including critical care time)  Medications Ordered in ED Medications  0.9 %  sodium chloride infusion ( Intravenous New Bag/Given 09/18/19 1532)  sodium chloride 0.9 % bolus 500 mL (0 mLs Intravenous Stopped 09/18/19 1656)  iohexol (OMNIPAQUE) 300 MG/ML solution 100 mL (100 mLs Intravenous Contrast Given 09/18/19 1613)    ED Course  I have reviewed the triage vital signs and the nursing notes.  Pertinent labs & imaging results that were available during my care of the patient were reviewed by me and considered in my medical decision making (see chart for details).    MDM Rules/Calculators/A&P                     Patient's abdomen nontender no acute distress.  Labs show a little bit of leukocytosis.  Also some mild hypokalemia.  Will get CT scan to rule in or rule out an exacerbation of her inflammatory bowel disease.  Patient nontoxic no acute distress.  Also will give some IV fluids here.  CT scan here was surprisingly very normal.  Other than the mild leukocytosis patient without any abdominal pain or tenderness.  No evidence of any inflammatory process.  No small bowel evidence of any  significant diarrhea.  We will give a trial of Imodium A-D.  Do not feel the need to start patient on antibiotics at this time.  And I do not feel that we need a stool sample at this time.  Will have patient follow-up with [facility] gastroenterology.   Final Clinical Impression(s) / ED Diagnoses Final diagnoses:  Diarrhea, unspecified type  Hypokalemia    Rx / DC Orders ED Discharge Orders         Ordered    loperamide (IMODIUM A-D) 2 MG tablet  4 times daily PRN     09/18/19 1702           Fredia Sorrow, MD 09/18/19 1704

## 2019-09-18 NOTE — ED Triage Notes (Signed)
Diarrhea and weakness x 5 days. She had her second Covid vaccine 3 days before the symptoms.

## 2021-08-26 IMAGING — CT CT ABD-PELV W/ CM
2 of 5 series · 16 of 46 positions shown, 18 images · IV contrast (Omnipaque)
Comparison: CT scan dated 04/08/2017

CLINICAL DATA: Inflammatory bowel disease. Diarrhea. Weakness.

EXAM:
CT ABDOMEN AND PELVIS WITH CONTRAST
TECHNIQUE: Multidetector CT imaging of the abdomen and pelvis was performed
using the standard protocol following bolus administration of
intravenous contrast.
CONTRAST:  100mL OMNIPAQUE IOHEXOL 300 MG/ML  SOLN

[Series 2: axial st · axial · 0.85mm/px · z∈[-456,-46]mm · 13 of 94 slices shown, 15 images]
[im 6/94  soft-tissue]
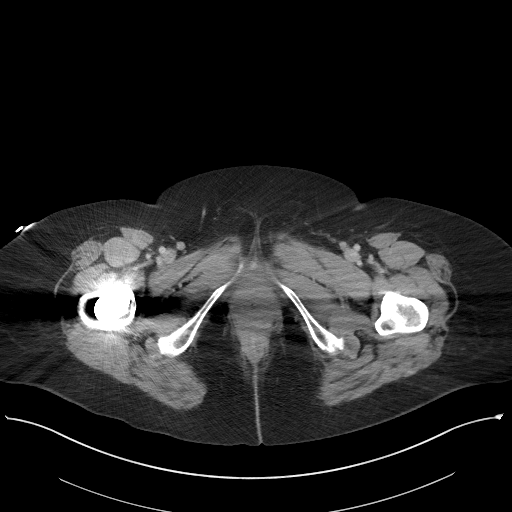
[im 6/94  bone]
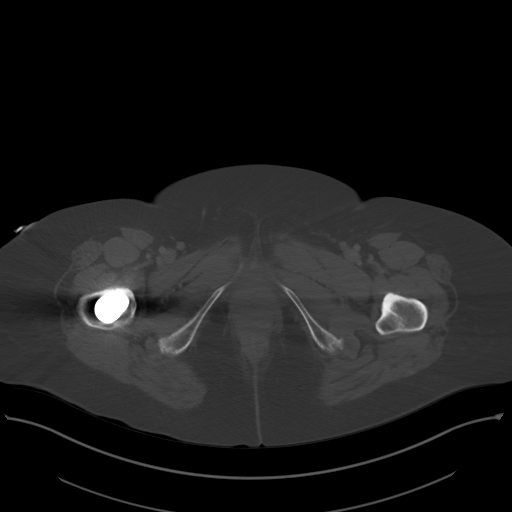
[im 11/94  soft-tissue]
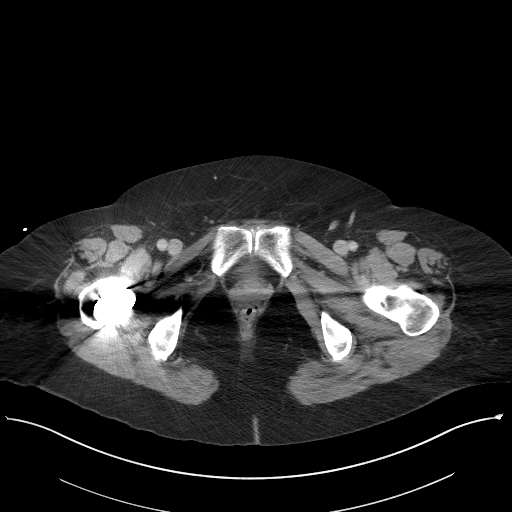
[im 21/94  soft-tissue]
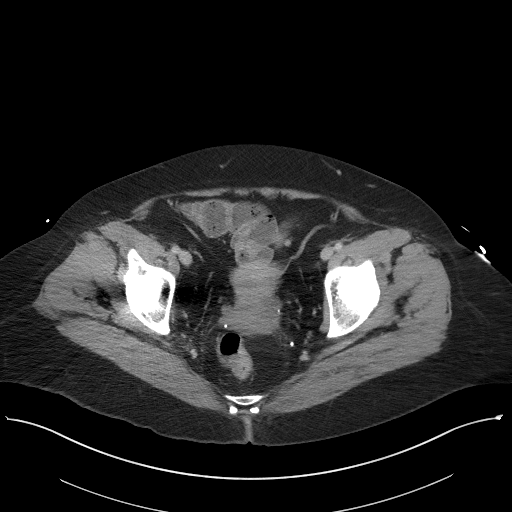
[im 26/94  soft-tissue]
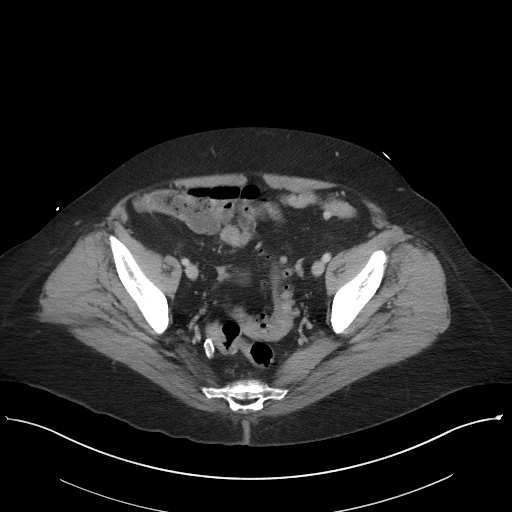
[im 32/94  soft-tissue]
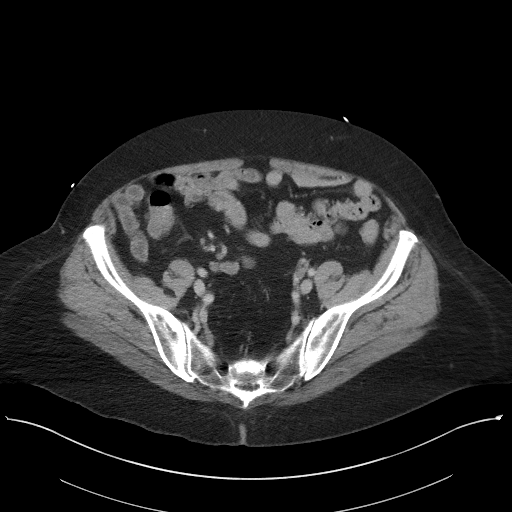
[im 42/94  soft-tissue]
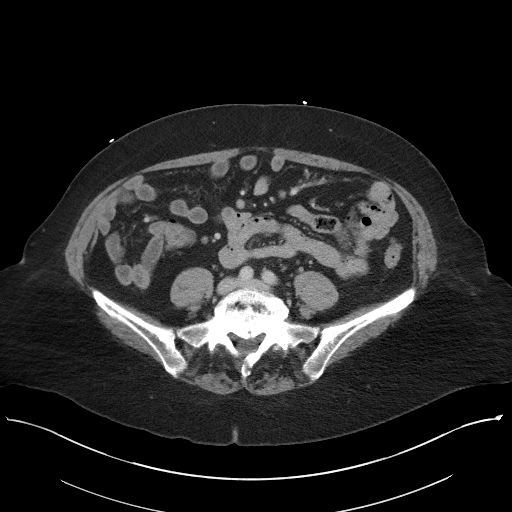
[im 47/94  soft-tissue]
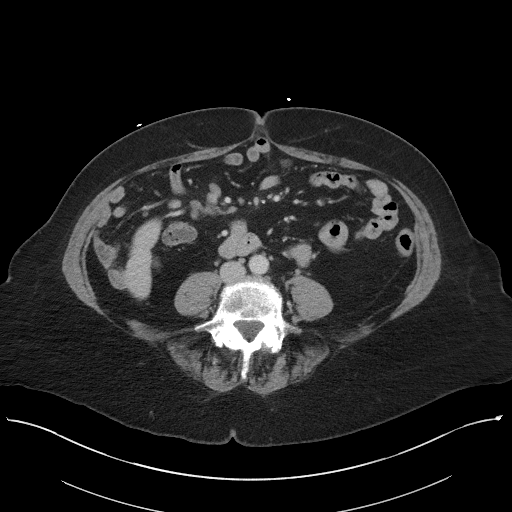
[im 52/94  soft-tissue]
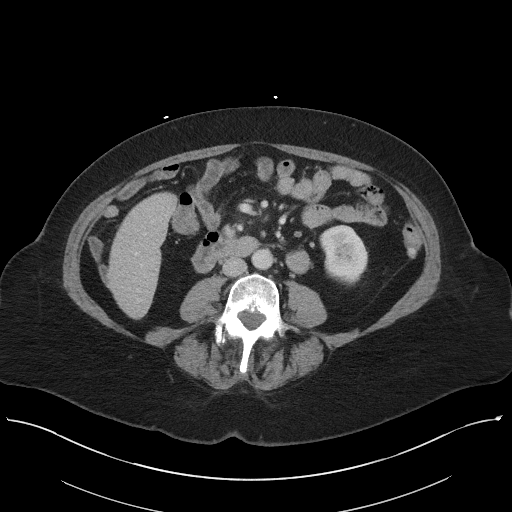
[im 63/94  soft-tissue]
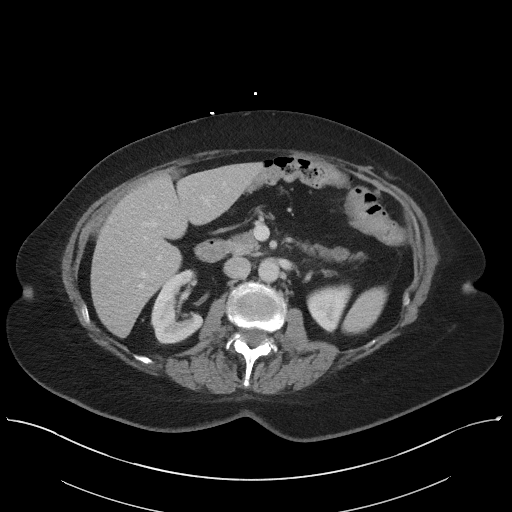
[im 63/94  bone]
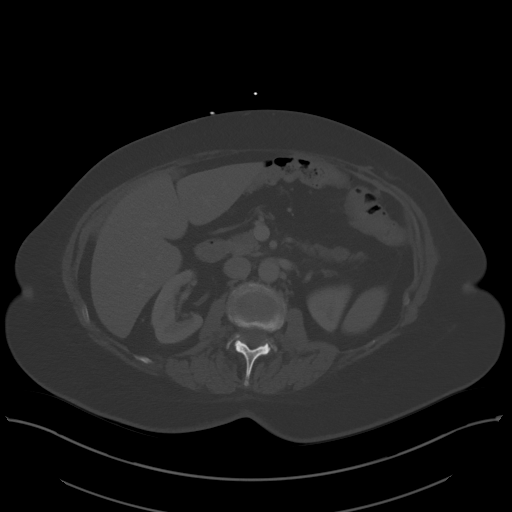
[im 68/94  soft-tissue]
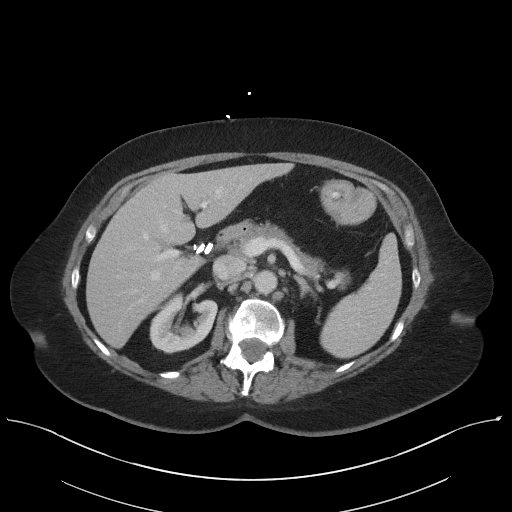
[im 73/94  soft-tissue]
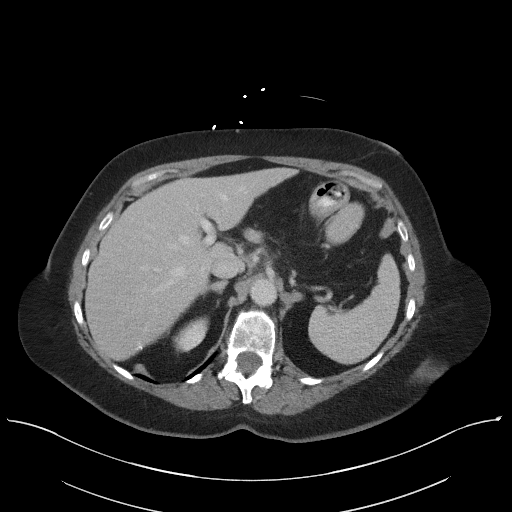
[im 83/94  soft-tissue]
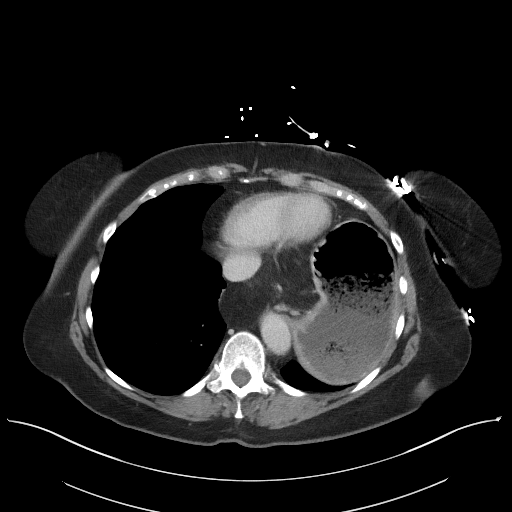
[im 88/94  soft-tissue]
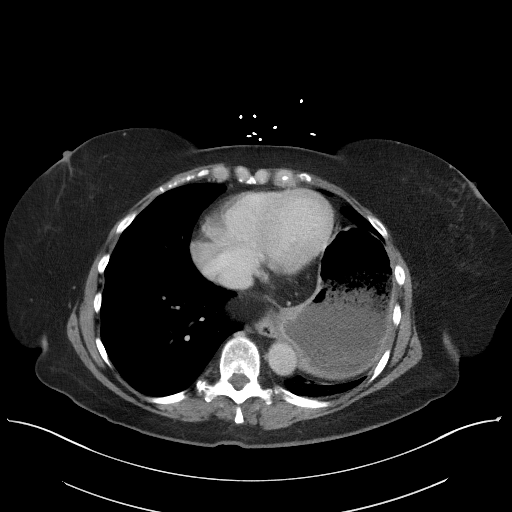

[Series 5: coronal st · coronal · 0.72mm/px · 3 of 101 slices shown]
[im 34/101  soft-tissue]
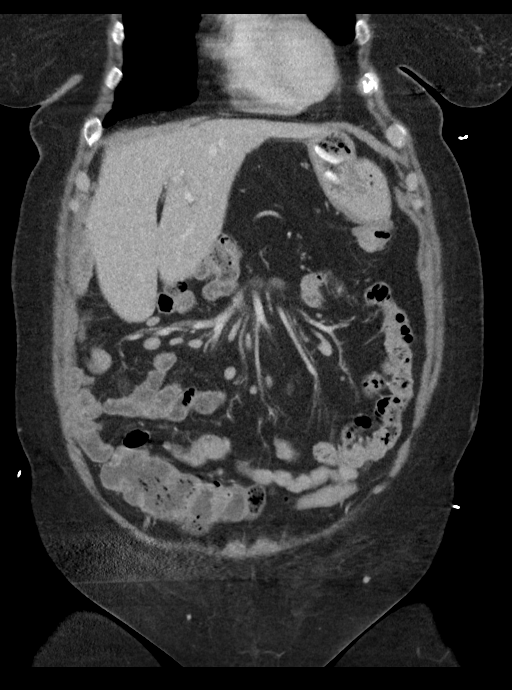
[im 45/101  soft-tissue]
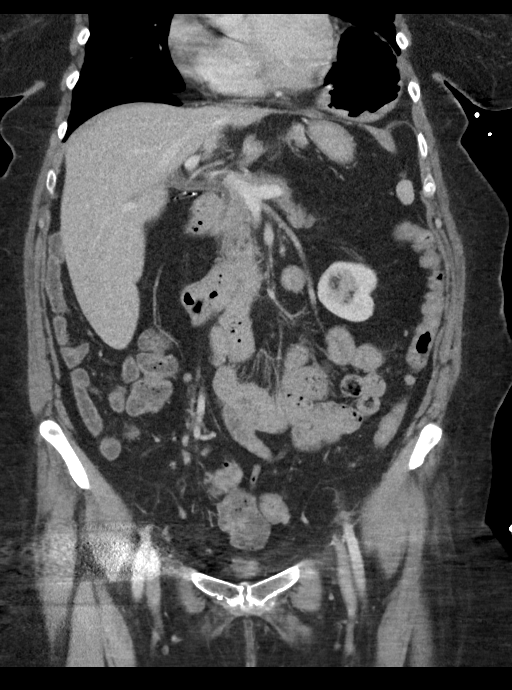
[im 56/101  soft-tissue]
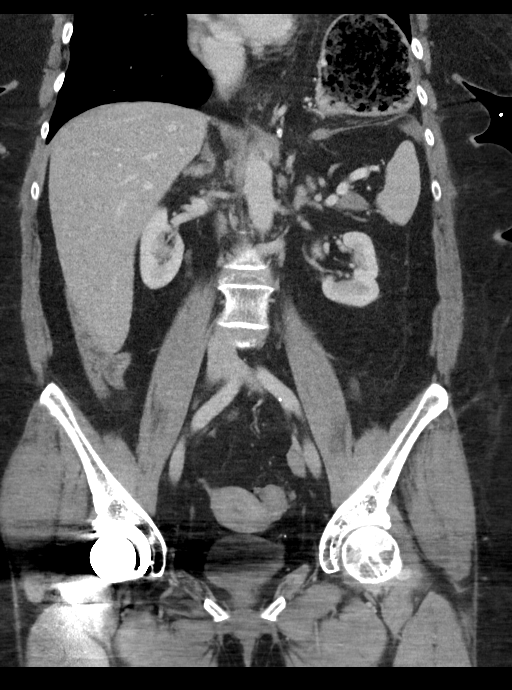

[16 of 46 positions shown; findings below may reference images not displayed]

FINDINGS: Lower chest: Chronic large hiatal hernia. The entire fundus of the
stomach is within the hernia.

Hepatobiliary: No focal liver abnormality is seen. Status post
cholecystectomy. No biliary dilatation.

Pancreas: Unremarkable. No pancreatic ductal dilatation or
surrounding inflammatory changes.

Spleen: Normal in size without focal abnormality.

Adrenals/Urinary Tract: Tiny stable cyst in the mid right kidney.
Kidneys are otherwise normal. Adrenal glands are normal. Bladder is
normal. Some detail of the bladder is obscured by the metallic
artifact from the right hip prosthesis.

Stomach/Bowel: Large chronic hiatal hernia. The small bowel is
normal including the terminal ileum. Numerous diverticula throughout
the colon most extensive in the sigmoid region without
diverticulitis. Appendix is normal.

Vascular/Lymphatic: No significant vascular findings are present. No
enlarged abdominal or pelvic lymph nodes.

Reproductive: Uterus and bilateral adnexa are unremarkable.

Other: No abdominal wall hernia or abnormality. No abdominopelvic
ascites.

Musculoskeletal: No acute osseous findings. Degenerative disc and
joint disease in the lower lumbar spine. Right total hip prosthesis.
IMPRESSION: 1. No acute abnormalities of the abdomen or pelvis.
2. Extensive diverticulosis of the colon.
3. Large chronic hiatal hernia.
